# Patient Record
Sex: Male | Born: 1970 | Race: White | Hispanic: No | Smoking: Current every day smoker
Health system: Southern US, Community
[De-identification: ages and names within clinical notes are randomized; demographics above are authoritative.]

## PROBLEM LIST (undated history)

## (undated) DIAGNOSIS — I639 Cerebral infarction, unspecified: Secondary | ICD-10-CM

## (undated) DIAGNOSIS — M545 Low back pain: Secondary | ICD-10-CM

## (undated) DIAGNOSIS — I10 Essential (primary) hypertension: Secondary | ICD-10-CM

## (undated) HISTORY — PX: NO PAST SURGERIES: SHX2092

---
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
## 1998-03-10 ENCOUNTER — Ambulatory Visit (HOSPITAL_COMMUNITY): Admission: RE | Admit: 1998-03-10 | Discharge: 1998-03-10 | Payer: Self-pay | Admitting: Gynecology

## 1998-04-12 ENCOUNTER — Ambulatory Visit (HOSPITAL_COMMUNITY): Admission: RE | Admit: 1998-04-12 | Discharge: 1998-04-12 | Payer: Self-pay | Admitting: Urology

## 1998-04-12 ENCOUNTER — Encounter: Payer: Self-pay | Admitting: Urology

## 1998-04-20 ENCOUNTER — Ambulatory Visit (HOSPITAL_COMMUNITY): Admission: RE | Admit: 1998-04-20 | Discharge: 1998-04-20 | Payer: Self-pay | Admitting: Urology

## 2005-12-23 ENCOUNTER — Emergency Department (HOSPITAL_COMMUNITY): Admission: EM | Admit: 2005-12-23 | Discharge: 2005-12-24 | Payer: Self-pay | Admitting: Emergency Medicine

## 2005-12-27 ENCOUNTER — Ambulatory Visit (HOSPITAL_COMMUNITY): Admission: RE | Admit: 2005-12-27 | Discharge: 2005-12-27 | Payer: Self-pay | Admitting: Orthopedic Surgery

## 2006-02-18 ENCOUNTER — Encounter: Admission: RE | Admit: 2006-02-18 | Discharge: 2006-04-12 | Payer: Self-pay | Admitting: Orthopedic Surgery

## 2007-05-15 HISTORY — PX: BACK SURGERY: SHX140

## 2007-06-11 ENCOUNTER — Ambulatory Visit (HOSPITAL_COMMUNITY): Admission: RE | Admit: 2007-06-11 | Discharge: 2007-06-12 | Payer: Self-pay | Admitting: Orthopedic Surgery

## 2008-05-24 IMAGING — CR DG LUMBAR SPINE 2-3V
1 series · 1 of 1 positions shown · non-contrast
Comparison: none

CLINICAL DATA: Back pain and right-sided HNP at L5-S1.
 LUMBAR SPINE ? 2 VIEWS ? 06/11/07:

[view not recorded]
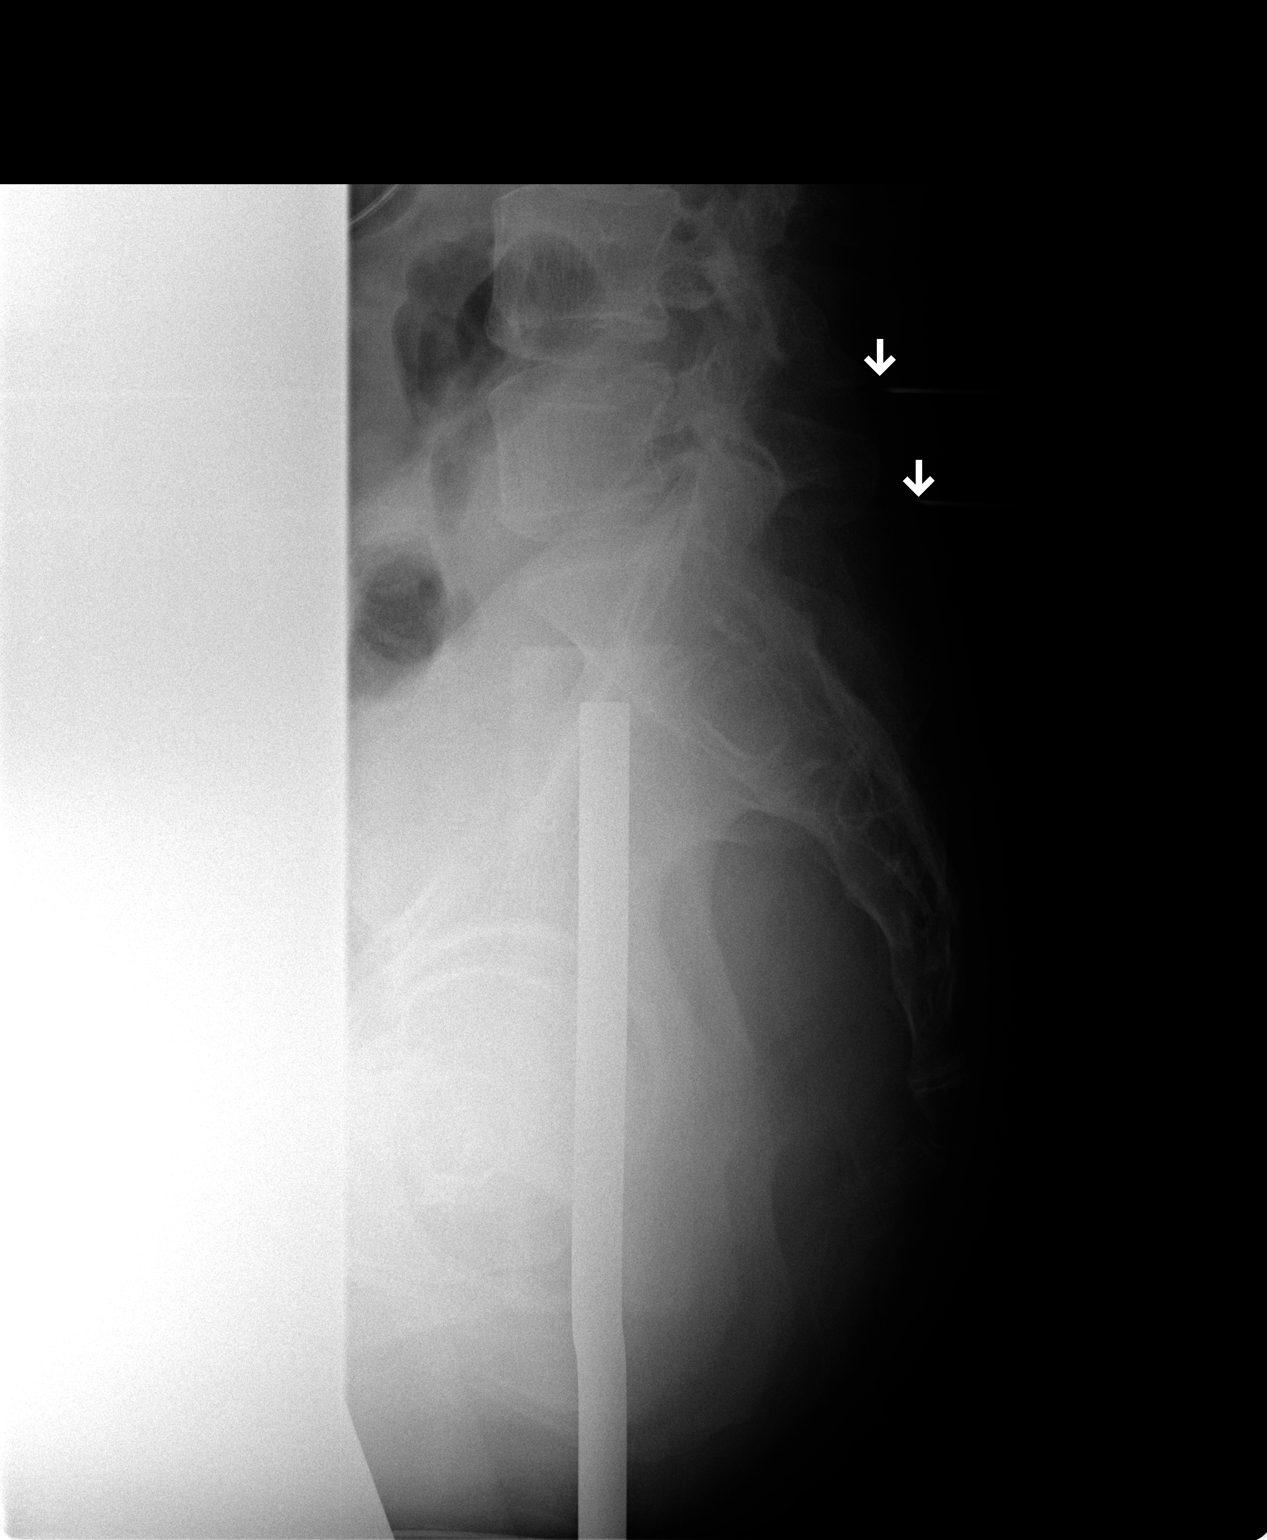

[1 of 1 positions shown; findings below may reference images not displayed]

FINDINGS: Intraoperative film #1 at 1777 hours demonstrates needles directed above and below the spinous process of L5.
IMPRESSION: L5 marked.
 Intraoperative film #2 at 7871 hours demonstrates a probe directed most closely towards the L5-S1 disk space from an inferior approach.
IMPRESSION: L5-S1 marked.

## 2010-09-26 NOTE — Op Note (Signed)
NAME:  Mason Decker, TOWN NO.:  [id number]   MEDICAL RECORD NO.:  [id number]          PATIENT TYPE:  AMB   LOCATION:  DAY                          FACILITY:  [facility]   PHYSICIAN:  Georges Lynch. Gioffre, M.D.DATE OF BIRTH:  [birth year]   DATE OF PROCEDURE:  06/11/2007  DATE OF DISCHARGE:                               OPERATIVE REPORT   SURGEON:  Georges Lynch. Darrelyn Hillock, M.D.   ASSISTANT:  Venita Lick, MD   PREOPERATIVE DIAGNOSES:  1. 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  2. Herniated lumbar disk L5-S1 on the right.   POSTOPERATIVE DIAGNOSES:  1. 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  2. Herniated lumbar disk L5-S1 on the right.   OPERATION:  1. Decompression of the lateral recess at L5-S1 on the right.  2. Microdiskectomy at L5-S1 on the right for a large herniated disk.   Note all his pain was in the right lower extremity only preop.   PROCEDURE:  Under general anesthesia, routine orthopedic prep and  draping of the lower back was carried out.  The patient had 2 grams IV  Ancef preop.  Sterile prep and drape was carried out and he had his 2  grams of IV Ancef.  Two needles were placed in back for localization  purposes.  An x-ray was taken.  Following that we then made an incision  over the L5-S1 interspace.  Bleeders identified and cauterized.  We went  down to the L5-S1 space, separated the muscle from the lamina and  spinous process and inserted self-retaining retractors.  Following that,  we then did a hemilaminectomy and decompressed the lateral recess.  The  ligamentum flavum was extremely thick and he was very tight in the  recess.  We brought the microscope in to complete the dissection.  We  gently removed the ligamentum flavum from the dura.  We went down  identified the disk space, gently retracted the dura.  There was a large  herniated disk at L5-S1 on the right.  Following that, we cauterized  lateral recess veins in the usual fashion.   I went down made a cruciate  incision in the disk space.  I utilized a nerve hook and the Epstein  curettes and brought to the disk material out from under the  subligamentous space.  We then went down into the disk space and  completed diskectomy.  Note, multiple passes were made up under the  subligamentous space.  We did a nice foraminotomy as well decompressed  the recess.  After the procedure was done we were able to easily pass a  hockey-stick out the foramina for the S1 root and the 5 root.  We had a  nice thorough decompression.  The dura moved over much easier now.  We  thoroughly irrigated out the area again, loosely applied some thrombin-  soaked Gelfoam, closed the wound layers usual fashion.  I left a small  distal part of the wound deep part of the wound open for  drainage purposes.  Sterile Neosporin dressings were applied.  Note  prior to  surgery, I did inject about 10 mL of 0.5% Marcaine epinephrine  into the wound site, that was prior to the surgery.  As I mentioned then  we applied sterile dressings.  Following that before he left the  operating he had 10 mg of Decadron IV.           ______________________________  Georges Lynch. Darrelyn Hillock, M.D.     RAG/MEDQ  D:  06/11/2007  T:  06/12/2007  Job:  [id number]

## 2011-02-01 LAB — DIFFERENTIAL
Basophils Absolute: 0
Basophils Relative: 0
Eosinophils Absolute: 0.1
Neutro Abs: 7.1
Neutrophils Relative %: 80 — ABNORMAL HIGH

## 2011-02-01 LAB — PROTIME-INR
INR: 1
Prothrombin Time: 13.1

## 2011-02-01 LAB — URINALYSIS, ROUTINE W REFLEX MICROSCOPIC
Bilirubin Urine: NEGATIVE
Glucose, UA: NEGATIVE
Nitrite: NEGATIVE
Specific Gravity, Urine: 1.004 — ABNORMAL LOW
pH: 8

## 2011-02-01 LAB — CBC
HCT: 42.2
Hemoglobin: 14.6
RBC: 4.43

## 2011-02-01 LAB — COMPREHENSIVE METABOLIC PANEL
ALT: 29
Alkaline Phosphatase: 42
BUN: 7
CO2: 27
GFR calc non Af Amer: >60
Glucose, Bld: 99
Potassium: 4.1
Sodium: 137
Total Bilirubin: 1.1

## 2011-02-01 LAB — APTT: aPTT: 27

## 2018-03-04 ENCOUNTER — Emergency Department (HOSPITAL_COMMUNITY): Payer: BLUE CROSS/BLUE SHIELD

## 2018-03-04 ENCOUNTER — Other Ambulatory Visit: Payer: Self-pay

## 2018-03-04 ENCOUNTER — Inpatient Hospital Stay (HOSPITAL_COMMUNITY)
Admission: EM | Admit: 2018-03-04 | Discharge: 2018-03-05 | DRG: 062 | Disposition: A | Discharge status: Home / Self Care | Payer: BLUE CROSS/BLUE SHIELD | Attending: Neurology | Admitting: Neurology

## 2018-03-04 ENCOUNTER — Inpatient Hospital Stay (HOSPITAL_COMMUNITY): Payer: BLUE CROSS/BLUE SHIELD

## 2018-03-04 ENCOUNTER — Encounter (HOSPITAL_COMMUNITY): Payer: Self-pay | Admitting: Neurology

## 2018-03-04 DIAGNOSIS — R402142 Coma scale, eyes open, spontaneous, at arrival to emergency department: Secondary | ICD-10-CM | POA: Diagnosis present

## 2018-03-04 DIAGNOSIS — I426 Alcoholic cardiomyopathy: Secondary | ICD-10-CM | POA: Diagnosis not present

## 2018-03-04 DIAGNOSIS — R402252 Coma scale, best verbal response, oriented, at arrival to emergency department: Secondary | ICD-10-CM | POA: Diagnosis present

## 2018-03-04 DIAGNOSIS — R402362 Coma scale, best motor response, obeys commands, at arrival to emergency department: Secondary | ICD-10-CM | POA: Diagnosis present

## 2018-03-04 DIAGNOSIS — I63311 Cerebral infarction due to thrombosis of right middle cerebral artery: Secondary | ICD-10-CM | POA: Diagnosis not present

## 2018-03-04 DIAGNOSIS — I161 Hypertensive emergency: Secondary | ICD-10-CM | POA: Diagnosis present

## 2018-03-04 DIAGNOSIS — Z23 Encounter for immunization: Secondary | ICD-10-CM | POA: Diagnosis not present

## 2018-03-04 DIAGNOSIS — I639 Cerebral infarction, unspecified: Secondary | ICD-10-CM | POA: Diagnosis present

## 2018-03-04 DIAGNOSIS — F17210 Nicotine dependence, cigarettes, uncomplicated: Secondary | ICD-10-CM | POA: Diagnosis present

## 2018-03-04 DIAGNOSIS — F10239 Alcohol dependence with withdrawal, unspecified: Secondary | ICD-10-CM | POA: Diagnosis present

## 2018-03-04 DIAGNOSIS — F17200 Nicotine dependence, unspecified, uncomplicated: Secondary | ICD-10-CM | POA: Diagnosis not present

## 2018-03-04 DIAGNOSIS — F1010 Alcohol abuse, uncomplicated: Secondary | ICD-10-CM | POA: Diagnosis present

## 2018-03-04 DIAGNOSIS — R471 Dysarthria and anarthria: Secondary | ICD-10-CM | POA: Diagnosis present

## 2018-03-04 DIAGNOSIS — G8194 Hemiplegia, unspecified affecting left nondominant side: Secondary | ICD-10-CM | POA: Diagnosis present

## 2018-03-04 DIAGNOSIS — E785 Hyperlipidemia, unspecified: Secondary | ICD-10-CM | POA: Diagnosis not present

## 2018-03-04 DIAGNOSIS — I6381 Other cerebral infarction due to occlusion or stenosis of small artery: Secondary | ICD-10-CM | POA: Diagnosis present

## 2018-03-04 DIAGNOSIS — I5030 Unspecified diastolic (congestive) heart failure: Secondary | ICD-10-CM | POA: Diagnosis not present

## 2018-03-04 DIAGNOSIS — Z8249 Family history of ischemic heart disease and other diseases of the circulatory system: Secondary | ICD-10-CM

## 2018-03-04 DIAGNOSIS — R29705 NIHSS score 5: Secondary | ICD-10-CM | POA: Diagnosis present

## 2018-03-04 LAB — I-STAT CHEM 8, ED
BUN: 14 mg/dL (ref 6–20)
CHLORIDE: 102 mmol/L (ref 98–111)
CREATININE: 0.8 mg/dL (ref 0.61–1.24)
Calcium, Ion: 1.06 mmol/L — ABNORMAL LOW (ref 1.15–1.40)
Glucose, Bld: 102 mg/dL — ABNORMAL HIGH (ref 70–99)
HEMATOCRIT: 49 % (ref 39.0–52.0)
Hemoglobin: 16.7 g/dL (ref 13.0–17.0)
POTASSIUM: 5.7 mmol/L — AB (ref 3.5–5.1)
Sodium: 135 mmol/L (ref 135–145)
TCO2: 29 mmol/L (ref 22–32)

## 2018-03-04 LAB — DIFFERENTIAL
Abs Immature Granulocytes: 0.03 10*3/uL (ref 0.00–0.07)
BASOS PCT: 1 %
Basophils Absolute: 0.1 10*3/uL (ref 0.0–0.1)
Eosinophils Absolute: 0.4 10*3/uL (ref 0.0–0.5)
Eosinophils Relative: 4 %
Immature Granulocytes: 0 %
Lymphocytes Relative: 28 %
Lymphs Abs: 2.8 10*3/uL (ref 0.7–4.0)
MONO ABS: 0.8 10*3/uL (ref 0.1–1.0)
MONOS PCT: 7 %
NEUTROS PCT: 60 %
Neutro Abs: 6.2 10*3/uL (ref 1.7–7.7)

## 2018-03-04 LAB — COMPREHENSIVE METABOLIC PANEL
ALT: 16 U/L (ref 0–44)
AST: 18 U/L (ref 15–41)
Albumin: 4.4 g/dL (ref 3.5–5.0)
Alkaline Phosphatase: 50 U/L (ref 38–126)
Anion gap: 9 (ref 5–15)
BUN: 10 mg/dL (ref 6–20)
CHLORIDE: 105 mmol/L (ref 98–111)
CO2: 21 mmol/L — AB (ref 22–32)
CREATININE: 0.92 mg/dL (ref 0.61–1.24)
Calcium: 9.5 mg/dL (ref 8.9–10.3)
GFR calc Af Amer: >60 mL/min (ref >60)
GLUCOSE: 103 mg/dL — AB (ref 70–99)
POTASSIUM: 3.9 mmol/L (ref 3.5–5.1)
Sodium: 135 mmol/L (ref 135–145)
Total Bilirubin: 0.9 mg/dL (ref 0.3–1.2)
Total Protein: 7.1 g/dL (ref 6.5–8.1)

## 2018-03-04 LAB — CBC
HCT: 49.2 % (ref 39.0–52.0)
HEMOGLOBIN: 16.1 g/dL (ref 13.0–17.0)
MCH: 32.9 pg (ref 26.0–34.0)
MCHC: 32.7 g/dL (ref 30.0–36.0)
MCV: 100.6 fL — ABNORMAL HIGH (ref 80.0–100.0)
Platelets: 274 10*3/uL (ref 150–400)
RBC: 4.89 MIL/uL (ref 4.22–5.81)
RDW: 11.8 % (ref 11.5–15.5)
WBC: 10.2 10*3/uL (ref 4.0–10.5)
nRBC: 0 % (ref 0.0–0.2)

## 2018-03-04 LAB — MRSA PCR SCREENING: MRSA BY PCR: NEGATIVE

## 2018-03-04 LAB — I-STAT TROPONIN, ED: TROPONIN I, POC: 0.01 ng/mL (ref 0.00–0.08)

## 2018-03-04 LAB — PROTIME-INR
INR: 1.12
Prothrombin Time: 14.3 seconds (ref 11.4–15.2)

## 2018-03-04 LAB — CBG MONITORING, ED: Glucose-Capillary: 98 mg/dL (ref 70–99)

## 2018-03-04 LAB — APTT: APTT: 29 s (ref 24–36)

## 2018-03-04 MED ORDER — SODIUM CHLORIDE 0.9 % IV BOLUS
250.0000 mL | Freq: Once | INTRAVENOUS | Status: AC
  Administered 2018-03-04: 250 mL via INTRAVENOUS

## 2018-03-04 MED ORDER — IOPAMIDOL (ISOVUE-370) INJECTION 76%
INTRAVENOUS | Status: AC
  Filled 2018-03-04: qty 50

## 2018-03-04 MED ORDER — VITAMIN B-1 100 MG PO TABS
100.0000 mg | ORAL_TABLET | Freq: Every day | ORAL | Status: DC
  Administered 2018-03-05: 100 mg via ORAL
  Filled 2018-03-04: qty 1

## 2018-03-04 MED ORDER — CLEVIDIPINE BUTYRATE 0.5 MG/ML IV EMUL
INTRAVENOUS | Status: AC
  Administered 2018-03-04: 1 mg
  Filled 2018-03-04: qty 50

## 2018-03-04 MED ORDER — IOPAMIDOL (ISOVUE-370) INJECTION 76%
100.0000 mL | Freq: Once | INTRAVENOUS | Status: AC
  Administered 2018-03-04: 50 mL via INTRAVENOUS

## 2018-03-04 MED ORDER — STROKE: EARLY STAGES OF RECOVERY BOOK
Freq: Once | Status: DC
  Filled 2018-03-04: qty 1

## 2018-03-04 MED ORDER — INFLUENZA VAC SPLIT QUAD 0.5 ML IM SUSY
0.5000 mL | PREFILLED_SYRINGE | INTRAMUSCULAR | Status: AC
  Administered 2018-03-05: 0.5 mL via INTRAMUSCULAR
  Filled 2018-03-04: qty 0.5

## 2018-03-04 MED ORDER — SENNOSIDES-DOCUSATE SODIUM 8.6-50 MG PO TABS: 1.0000 | ORAL_TABLET | Freq: Every evening | ORAL | Status: DC | PRN

## 2018-03-04 MED ORDER — LABETALOL HCL 5 MG/ML IV SOLN
10.0000 mg | INTRAVENOUS | Status: DC | PRN
  Administered 2018-03-05: 20 mg via INTRAVENOUS
  Administered 2018-03-05: 10 mg via INTRAVENOUS
  Administered 2018-03-05: 20 mg via INTRAVENOUS
  Administered 2018-03-05: 10 mg via INTRAVENOUS
  Filled 2018-03-04 (×3): qty 4

## 2018-03-04 MED ORDER — FOLIC ACID 1 MG PO TABS
1.0000 mg | ORAL_TABLET | Freq: Every day | ORAL | Status: DC
  Administered 2018-03-05: 1 mg via ORAL
  Filled 2018-03-04: qty 1

## 2018-03-04 MED ORDER — SODIUM CHLORIDE 0.9 % IV SOLN
INTRAVENOUS | Status: DC
  Administered 2018-03-04: 13:00:00 via INTRAVENOUS

## 2018-03-04 MED ORDER — DEXMEDETOMIDINE HCL IN NACL 200 MCG/50ML IV SOLN: 0.2000 ug/kg/h | INTRAVENOUS | Status: DC

## 2018-03-04 MED ORDER — FAMOTIDINE IN NACL 20-0.9 MG/50ML-% IV SOLN
20.0000 mg | Freq: Two times a day (BID) | INTRAVENOUS | Status: DC
  Administered 2018-03-04 – 2018-03-05 (×2): 20 mg via INTRAVENOUS
  Filled 2018-03-04 (×2): qty 50

## 2018-03-04 MED ORDER — LABETALOL HCL 5 MG/ML IV SOLN
INTRAVENOUS | Status: AC | PRN
  Administered 2018-03-04: 10 mg via INTRAVENOUS
  Administered 2018-03-04 (×2): 20 mg via INTRAVENOUS

## 2018-03-04 MED ORDER — ALTEPLASE (STROKE) FULL DOSE INFUSION
0.9000 mg/kg | Freq: Once | INTRAVENOUS | Status: AC
  Administered 2018-03-04: 79.6 mg via INTRAVENOUS
  Filled 2018-03-04: qty 100

## 2018-03-04 MED ORDER — ACETAMINOPHEN 650 MG RE SUPP: 650.0000 mg | RECTAL | Status: DC | PRN

## 2018-03-04 MED ORDER — LABETALOL HCL 5 MG/ML IV SOLN
INTRAVENOUS | Status: AC
  Filled 2018-03-04: qty 4

## 2018-03-04 MED ORDER — SODIUM CHLORIDE 0.9 % IV SOLN
50.0000 mL | Freq: Once | INTRAVENOUS | Status: AC
  Administered 2018-03-04: 50 mL via INTRAVENOUS

## 2018-03-04 MED ORDER — ACETAMINOPHEN 160 MG/5ML PO SOLN: 650.0000 mg | ORAL | Status: DC | PRN

## 2018-03-04 MED ORDER — ACETAMINOPHEN 325 MG PO TABS
650.0000 mg | ORAL_TABLET | ORAL | Status: DC | PRN
  Administered 2018-03-04: 650 mg via ORAL
  Filled 2018-03-04: qty 2

## 2018-03-04 MED ORDER — CLEVIDIPINE BUTYRATE 0.5 MG/ML IV EMUL
0.0000 mg/h | INTRAVENOUS | Status: DC
  Administered 2018-03-04: 19 mg/h via INTRAVENOUS
  Administered 2018-03-04: 16 mg/h via INTRAVENOUS
  Filled 2018-03-04 (×2): qty 50

## 2018-03-04 NOTE — Code Documentation (Signed)
47 year old male coming from work where he was noted to have sudden onset of left facial numbness, slurred speech, and left arm weakness at 0915. Coworker called 911 and EMS activated a Code Stroke. Stroke Team met patient upon arrival. Initial NIHSS 5 due to left facial droop, slurred speech, decreased sensory, left arm and leg drift. CT negative for hemorrhage. BP 240 palpable. 20 mg of labetalol given. SBP remained 222/135. Second dose of 20 mg of Labetalol given. BP remains elevated. Cleviprex started at 1027. Titrated per protocol. Pt's BP within parameter for tPA at 1038. tPA started. Cleviprex decreased. Pt alert an oriented x4. Pt to be admitted to [place].

## 2018-03-04 NOTE — ED Provider Notes (Signed)
[facility] EMERGENCY DEPARTMENT Provider Note   CSN: [id number] Arrival date & time: 03/04/18  1011   An emergency department physician performed an initial assessment on this suspected stroke patient at 1012.  History   Chief Complaint Chief Complaint  Patient presents with  . Code Stroke    HPI Mason Decker is a 47 y.o. male.  47 year old male with prior medical history as detailed below presents for evaluation of acute onset left-sided weakness.  Patient was a code stroke upon arrival.  Neuro team has evaluated the patient and deemed him to be not a candidate for TPA upon initial assessment.  Patient's blood pressure was significantly elevated upon arrival.  BP was controlled and the TPA was given.   Patient will be admitted to the neurology service for further work-up and treatment of his acute CVA.  The history is provided by the patient.  Cerebrovascular Accident  This is a new problem. The current episode started 1 to 2 hours ago. The problem occurs constantly. The problem has not changed since onset.Pertinent negatives include no chest pain and no abdominal pain. Nothing aggravates the symptoms. Nothing relieves the symptoms. He has tried nothing for the symptoms.    No past medical history on file.  Patient Active Problem List   Diagnosis Date Noted  . CVA (cerebral vascular accident) (HCC) 03/04/2018        Home Medications    Prior to Admission medications   Not on File    Family History Family History  Problem Relation Age of Onset  . Hypertension Mother   . Hypertension Father     Social History Social History   Tobacco Use  . Smoking status: Not on file  Substance Use Topics  . Alcohol use: Not on file  . Drug use: Not on file     Allergies   Patient has no allergy information on record.   Review of Systems Review of Systems  Cardiovascular: Negative for chest pain.  Gastrointestinal: Negative for abdominal  pain.  All other systems reviewed and are negative.    Physical Exam Updated Vital Signs BP (!) 163/103   Pulse 90   Resp 18   Wt 88.4 kg   SpO2 94%   Physical Exam  Constitutional: He is oriented to person, place, and time. He appears well-developed and well-nourished. No distress.  HENT:  Head: Normocephalic and atraumatic.  Mouth/Throat: Oropharynx is clear and moist.  Eyes: Pupils are equal, round, and reactive to light. Conjunctivae and EOM are normal.  Neck: Normal range of motion. Neck supple.  Cardiovascular: Normal rate, regular rhythm and normal heart sounds.  Pulmonary/Chest: Effort normal and breath sounds normal. No respiratory distress.  Abdominal: Soft. He exhibits no distension. There is no tenderness.  Musculoskeletal: Normal range of motion. He exhibits no edema or deformity.  Neurological: He is alert and oriented to person, place, and time.  AOx3 Normal speech Mild left facial droop  4/5 strength to LUE and LLE 5/5 strength to RUE and RLE   Skin: Skin is warm and dry.  Psychiatric: He has a normal mood and affect.  Nursing note and vitals reviewed.    ED Treatments / Results  Labs (all labs ordered are listed, but only abnormal results are displayed) Labs Reviewed  CBC - Abnormal; Notable for the following components:      Result Value   MCV 100.6 (*)    All other components within normal limits  I-STAT CHEM 8,  ED - Abnormal; Notable for the following components:   Potassium 5.7 (*)    Glucose, Bld 102 (*)    Calcium, Ion 1.06 (*)    All other components within normal limits  PROTIME-INR  APTT  DIFFERENTIAL  COMPREHENSIVE METABOLIC PANEL  HIV ANTIBODY (ROUTINE TESTING W REFLEX)  I-STAT TROPONIN, ED  CBG MONITORING, ED    EKG EKG Interpretation  Date/Time:  Tuesday March 04 2018 10:37:36 EDT Ventricular Rate:  92 PR Interval:    QRS Duration: 95 QT Interval:  402 QTC Calculation: 498 R Axis:   59 Text Interpretation:  Atrial  fibrillation Probable left ventricular hypertrophy Borderline prolonged QT interval Confirmed by Kristine Royal [phone]) on 03/04/2018 10:40:49 AM   Radiology Ct Head Code Stroke Wo Contrast  Result Date: 03/04/2018 CLINICAL DATA:  Code stroke. 47 year old male with sudden onset left side weakness, facial droop and slurred speech. EXAM: CT HEAD WITHOUT CONTRAST TECHNIQUE: Contiguous axial images were obtained from the base of the skull through the vertex without intravenous contrast. COMPARISON:  Head CT 12/23/2005. FINDINGS: Brain: Circumscribed oval hypodensity in the right lower pons on series 3 image 8 is new since 2007. There has been generalized cerebral volume loss since that time. Similar chronic appearing oval hypodensity in the posterior right lentiform on image 15 is new. No midline shift, ventriculomegaly, mass effect, evidence of mass lesion, intracranial hemorrhage or evidence of cortically based acute infarction. Vascular:  No suspicious intracranial vascular hyperdensity. Skull: Negative. Sinuses/Orbits: Increased anterior ethmoid and right frontal sinus mucosal thickening since 2007. Other Visualized paranasal sinuses and mastoids are stable and well pneumatized. Other: No acute orbit or scalp soft tissue findings. Postoperative changes to the right globe. ASPECTS [facility] Stroke Program Early CT Score) - Ganglionic level infarction (caudate, lentiform nuclei, internal capsule, insula, M1-M3 cortex): 7 - Supraganglionic infarction (M4-M6 cortex): 3 Total score (0-10 with 10 being normal): 10 IMPRESSION: 1. Age indeterminate but probably chronic lacunar infarcts in the right lentiform and right pons. 2. No superimposed acute cortically based infarct or acute intracranial hemorrhage. ASPECTS is 10. 3. These results were communicated to Dr. Laurence Slate at 10:33 amon 10/22/2019by text page via the [facility] messaging system. Electronically Signed   By: Odessa Fleming M.D.   On: 03/04/2018 10:34     Procedures Procedures (including critical care time) CRITICAL CARE Performed by: Wynetta Fines   Total critical care time: 15 minutes  Critical care time was exclusive of separately billable procedures and treating other patients.  Critical care was necessary to treat or prevent imminent or life-threatening deterioration.  Critical care was time spent personally by me on the following activities: development of treatment plan with patient and/or surrogate as well as nursing, discussions with consultants, evaluation of patient's response to treatment, examination of patient, obtaining history from patient or surrogate, ordering and performing treatments and interventions, ordering and review of laboratory studies, ordering and review of radiographic studies, pulse oximetry and re-evaluation of patient's condition.   Medications Ordered in ED Medications  alteplase (ACTIVASE) 1 mg/mL infusion 79.6 mg (79.6 mg Intravenous New Bag/Given 03/04/18 1038)    Followed by  0.9 %  sodium chloride infusion (has no administration in time range)  labetalol (NORMODYNE,TRANDATE) 5 MG/ML injection (has no administration in time range)  iopamidol (ISOVUE-370) 76 % injection 100 mL (has no administration in time range)   stroke: mapping our early stages of recovery book (has no administration in time range)  0.9 %  sodium chloride infusion (has no  administration in time range)  acetaminophen (TYLENOL) tablet 650 mg (has no administration in time range)    Or  acetaminophen (TYLENOL) solution 650 mg (has no administration in time range)    Or  acetaminophen (TYLENOL) suppository 650 mg (has no administration in time range)  senna-docusate (Senokot-S) tablet 1 tablet (has no administration in time range)  famotidine (PEPCID) IVPB 20 mg premix (has no administration in time range)  labetalol (NORMODYNE,TRANDATE) injection (10 mg Intravenous Given 03/04/18 1037)  clevidipine (CLEVIPREX) 0.5 MG/ML  infusion (16 mg/hr  Rate/Dose Change 03/04/18 1043)     Initial Impression / Assessment and Plan / ED Course  I have reviewed the triage vital signs and the nursing notes.  Pertinent labs & imaging results that were available during my care of the patient were reviewed by me and considered in my medical decision making (see chart for details).    MDM  Screen complete  47 year old male is presenting with symptoms consistent with acute CVA.  Patient given TPA after improvement in his blood pressures.   Patient will be admitted directly to the neurology service for further care and workup.  Final Clinical Impressions(s) / ED Diagnoses   Final diagnoses:  Cerebrovascular accident (CVA), unspecified mechanism [facility])    ED Discharge Orders    None       Wynetta Fines, MD 03/04/18 1101

## 2018-03-04 NOTE — Progress Notes (Signed)
Pharmacist Code Stroke Response  Notified to mix tPA at 1022 by Dr. Laurence Slate Delivered tPA to RN at 1026   tPA dose = 8mg  bolus over 1 minute followed by 71.6mg  for a total dose of 79.6mg  over 1 hour  Issues/delays encountered (if applicable): Blood pressure was very high and required aggressive treatment  Mason Decker, Mason Decker 03/04/18 10:32 AM

## 2018-03-04 NOTE — ED Notes (Signed)
10mg  of labatelol given IV. Most recent pressure 207/121

## 2018-03-04 NOTE — Progress Notes (Signed)
PT Cancellation Note  Patient Details Name: Mason Decker MRN: [id number] DOB: [birth year]   Cancelled Treatment:    Reason Eval/Treat Not Completed: Active bedrest order s/p tPA   Mason Decker 03/04/2018, 11:52 AM Delaney Meigs, PT Acute Rehabilitation Services Pager: [phone] Office: [phone]

## 2018-03-04 NOTE — H&P (Addendum)
Neurology history and physical  Code stroke   History is obtained from: Patient and EMS  HPI: Mason Decker is a 47 y.o. male who does smoke but has no other past medical history and has not seen a primary care physician.  Patient noted at around 9 AM this morning that he had weakness on his left side decreased sensation on his left side including dysarthria.  Apparently he got to work and his coworkers noted this also.  Blood pressures were taken while at work and all showed that his blood pressures were systolically in the 914N and diastolically in the 829F.  Patient did not have any blurred vision, flaccidity, expressive or receptive aphasia.  It is unclear what his blood pressures are at baseline.  As stated patient takes no medication and/or aspirin.   While in the ED patient was rushed to the CT scan, there was no acute bleed on CT and patient was offered TPA.  Patient was immediately given TPA after Cleviprex was given to get the blood pressure down to 621 systolically and 308 diastolically   LKW: 6578 this a.m. on 03/04/2018 tpa given?  Yes Premorbid modified Rankin scale (mRS):0  ROS: A 14 point ROS was performed and is negative except as noted in the HPI.  Patient has no past medical history.  Family History  Problem Relation Age of Onset  . Hypertension Mother   . Hypertension Father     Social History: Patient does smoke.  Medications  Current Facility-Administered Medications:  .   stroke: mapping our early stages of recovery book, , Does not apply, Once, Marliss Coots, PA-C .  alteplase (ACTIVASE) 1 mg/mL infusion 79.6 mg, 0.9 mg/kg, Intravenous, Once, Last Rate: 79.6 mL/hr at 03/04/18 1038, 79.6 mg at 03/04/18 1038 **FOLLOWED BY** 0.9 %  sodium chloride infusion, 50 mL, Intravenous, Once, Aroor, Sushanth R, MD .  0.9 %  sodium chloride infusion, , Intravenous, Continuous, Marliss Coots, PA-C .  acetaminophen (TYLENOL) tablet 650 mg, 650 mg, Oral, Q4H PRN **OR**  acetaminophen (TYLENOL) solution 650 mg, 650 mg, Per Tube, Q4H PRN **OR** acetaminophen (TYLENOL) suppository 650 mg, 650 mg, Rectal, Q4H PRN, Marliss Coots, PA-C .  famotidine (PEPCID) IVPB 20 mg premix, 20 mg, Intravenous, Q12H, Marliss Coots, PA-C .  iopamidol (ISOVUE-370) 76 % injection 100 mL, 100 mL, Intravenous, Once, Aroor, Karena Addison R, MD .  labetalol (NORMODYNE,TRANDATE) 5 MG/ML injection, , , ,  .  senna-docusate (Senokot-S) tablet 1 tablet, 1 tablet, Oral, QHS PRN, Marliss Coots, PA-C No current outpatient medications on file.   Exam: Current vital signs: BP (!) 207/121 (BP Location: Left Arm)   Wt 88.4 kg   SpO2 97%  Vital signs in last 24 hours: BP: (207)/(121) 207/121 (10/22 1037) SpO2:  [97 %] 97 % (10/22 1037) Weight:  [88.4 kg] 88.4 kg (10/22 1000)  Physical Exam  Constitutional: Appears well-developed and well-nourished.  Psych: Affect appropriate to situation Eyes: No scleral injection HENT: No OP obstrucion Head: Normocephalic.  Cardiovascular: Normal rate and regular rhythm.  Respiratory: Effort normal, non-labored breathing GI: Soft.  No distension. There is no tenderness.  Skin: WDI  Neuro: Mental Status: Patient is awake, alert, oriented to person, place, month, year, and situation. Patient is able to give a clear and coherent history. Patient is dysarthric Cranial Nerves: II: Visual Fields are full. Pupils are equal, round, and reactive to light.   III,IV, VI: EOMI without ptosis or diploplia.  V: Facial sensation  is symmetric to temperature VII: Facial movement is asymmetric on the left VIII: hearing is intact to voice X: Uvula elevates symmetrically XI: Shoulder shrug is symmetric. XII: tongue is midline without atrophy or fasciculations.  Motor: 5/5 on the right arm and leg, 3/5 on the left arm and 4/5 leg  Sensory: Sensation decreased on the left arm and leg Deep Tendon Reflexes: 2+ and symmetric in the biceps and patellae.   Plantars: Toes are downgoing bilaterally.  Cerebellar: FNF and HKS are intact bilaterally  Labs I have reviewed labs in epic and the results pertinent to this consultation are:   CBC    Component Value Date/Time   WBC 10.2 03/04/2018 1014   RBC 4.89 03/04/2018 1014   HGB 16.7 03/04/2018 1018   HCT 49.0 03/04/2018 1018   PLT 274 03/04/2018 1014   MCV 100.6 (H) 03/04/2018 1014   MCH 32.9 03/04/2018 1014   MCHC 32.7 03/04/2018 1014   RDW 11.8 03/04/2018 1014   LYMPHSABS 2.8 03/04/2018 1014   MONOABS 0.8 03/04/2018 1014   EOSABS 0.4 03/04/2018 1014   BASOSABS 0.1 03/04/2018 1014    CMP     Component Value Date/Time   NA 135 03/04/2018 1018   K 5.7 (H) 03/04/2018 1018   CL 102 03/04/2018 1018   CO2 27 06/11/2007 1655   GLUCOSE 102 (H) 03/04/2018 1018   BUN 14 03/04/2018 1018   CREATININE 0.80 03/04/2018 1018   CALCIUM 9.3 06/11/2007 1655   PROT 6.3 06/11/2007 1655   ALBUMIN 4.1 06/11/2007 1655   AST 22 06/11/2007 1655   ALT 29 06/11/2007 1655   ALKPHOS 42 06/11/2007 1655   BILITOT 1.1 06/11/2007 1655   GFRNONAA >60 06/11/2007 1655   GFRAA  06/11/2007 1655    >60        The eGFR has been calculated using the MDRD equation. This calculation has not been validated in all clinical    Lipid Panel  No results found for: CHOL, TRIG, HDL, CHOLHDL, VLDL, LDLCALC, LDLDIRECT   Imaging I have reviewed the images obtained:  CT-scan of the brain--age indeterminate but probably chronic lacunar infarcts in the right lentiform and right pons.  No superimposed acute cortical based infarct.  MRI examination of the brain--pending  Mason Quill PA-C [facility] [phone]  M-F  (9:00 am- 5:00 PM)  03/04/2018, 10:43 AM    NEUROHOSPITALIST ADDENDUM Performed a face to face diagnostic evaluation.   I have reviewed the contents of history and physical exam as documented by PA/ARNP/Resident and agree with above documentation.  I have discussed and  formulated the  plan as documented below.  Edits to the note have been made as needed.  Assessment: 47 year old male with tobacco abuse and alcohol abuse presenting to the hospital as a code stroke.  Symptoms included left-sided weakness and decreased sensation along with dysarthria.  Patient was immediately brought to CT which showed no acute infarct or bleed however he does have old lenticular infarcts on the right and pontine infarct.   tPA delayed as patient blood pressure was 528 systolic, requiring Labetalol 28m push x2 and 137mpush and Clevedipine infusion.    Acute Ischemic stroke   Etiology: pending evaluation  Risk factors; HTN, tobacco abuse  Plan: - Admit to ICU with telemetry --Will obtain CTA of head and neck  -  MRI brain w/o contrast - Keep blood pressure parameters less than 180/105 - Neuro checks as per order set - No antiplatelets until 24hrs s/p TPA -  Repeat CT at 10 AM tomorrow which would be 24 hours - Stroke swallow screen - Echocardiogram  - Call neurologist on duty if any significant changes occur.  HTN Emergency - goal BP less than 180/105 as patient received IVtPA - Cleveprix and Labetolol PRN ordered   -Alcohol withdrawal  CIWA protocol ordered Did not order benzos, please call MD if patient very agitated  This patient is neurologically critically ill due to acute stroke requiring tPA.  He is at risk for significant risk of neurological worsening from cerebral edema,  death from brain herniation, heart failure, hemorrhagic conversion, infection, respiratory failure and seizure. This patient's care requires constant monitoring of vital signs, hemodynamics, respiratory and cardiac monitoring, review of multiple databases, neurological assessment, discussion with family, other specialists and medical decision making of high complexity.  I spent 70  minutes of neurocritical time in the care of this patient.      Karena Addison Aroor MD [facility] [phone]   If 7pm to 7am, please call on call as listed on AMION.

## 2018-03-05 ENCOUNTER — Inpatient Hospital Stay (HOSPITAL_COMMUNITY): Payer: BLUE CROSS/BLUE SHIELD

## 2018-03-05 ENCOUNTER — Encounter (HOSPITAL_COMMUNITY): Payer: Self-pay | Admitting: Nurse Practitioner

## 2018-03-05 ENCOUNTER — Ambulatory Visit (HOSPITAL_COMMUNITY): Payer: BLUE CROSS/BLUE SHIELD

## 2018-03-05 DIAGNOSIS — I63311 Cerebral infarction due to thrombosis of right middle cerebral artery: Secondary | ICD-10-CM

## 2018-03-05 DIAGNOSIS — I161 Hypertensive emergency: Secondary | ICD-10-CM

## 2018-03-05 DIAGNOSIS — F17200 Nicotine dependence, unspecified, uncomplicated: Secondary | ICD-10-CM

## 2018-03-05 DIAGNOSIS — I5030 Unspecified diastolic (congestive) heart failure: Secondary | ICD-10-CM

## 2018-03-05 DIAGNOSIS — E785 Hyperlipidemia, unspecified: Secondary | ICD-10-CM

## 2018-03-05 DIAGNOSIS — I426 Alcoholic cardiomyopathy: Secondary | ICD-10-CM

## 2018-03-05 DIAGNOSIS — F1010 Alcohol abuse, uncomplicated: Secondary | ICD-10-CM | POA: Diagnosis present

## 2018-03-05 LAB — RAPID URINE DRUG SCREEN, HOSP PERFORMED
Amphetamines: NOT DETECTED
BENZODIAZEPINES: NOT DETECTED
Barbiturates: NOT DETECTED
COCAINE: NOT DETECTED
OPIATES: NOT DETECTED
Tetrahydrocannabinol: NOT DETECTED

## 2018-03-05 LAB — CBC
HEMATOCRIT: 45.6 % (ref 39.0–52.0)
Hemoglobin: 15 g/dL (ref 13.0–17.0)
MCH: 32 pg (ref 26.0–34.0)
MCHC: 32.9 g/dL (ref 30.0–36.0)
MCV: 97.2 fL (ref 80.0–100.0)
Platelets: 258 10*3/uL (ref 150–400)
RBC: 4.69 MIL/uL (ref 4.22–5.81)
RDW: 11.9 % (ref 11.5–15.5)
WBC: 8.2 10*3/uL (ref 4.0–10.5)
nRBC: 0 % (ref 0.0–0.2)

## 2018-03-05 LAB — COMPREHENSIVE METABOLIC PANEL
ALT: 14 U/L (ref 0–44)
AST: 14 U/L — AB (ref 15–41)
Albumin: 3.6 g/dL (ref 3.5–5.0)
Alkaline Phosphatase: 38 U/L (ref 38–126)
Anion gap: 8 (ref 5–15)
BUN: 10 mg/dL (ref 6–20)
CO2: 24 mmol/L (ref 22–32)
Calcium: 9.1 mg/dL (ref 8.9–10.3)
Chloride: 106 mmol/L (ref 98–111)
Creatinine, Ser: 0.91 mg/dL (ref 0.61–1.24)
GFR calc Af Amer: >60 mL/min (ref >60)
GFR calc non Af Amer: >60 mL/min (ref >60)
GLUCOSE: 101 mg/dL — AB (ref 70–99)
POTASSIUM: 3.5 mmol/L (ref 3.5–5.1)
Sodium: 138 mmol/L (ref 135–145)
Total Bilirubin: 0.6 mg/dL (ref 0.3–1.2)
Total Protein: 5.9 g/dL — ABNORMAL LOW (ref 6.5–8.1)

## 2018-03-05 LAB — HIV ANTIBODY (ROUTINE TESTING W REFLEX): HIV Screen 4th Generation wRfx: NONREACTIVE

## 2018-03-05 LAB — ECHOCARDIOGRAM COMPLETE
Height: 70 in
Weight: 3111.13 oz

## 2018-03-05 LAB — LIPID PANEL
Cholesterol: 164 mg/dL (ref 0–200)
HDL: 54 mg/dL (ref >40)
LDL CALC: 89 mg/dL (ref 0–99)
TRIGLYCERIDES: 106 mg/dL (ref <150)
Total CHOL/HDL Ratio: 3 RATIO
VLDL: 21 mg/dL (ref 0–40)

## 2018-03-05 LAB — HEMOGLOBIN A1C
HEMOGLOBIN A1C: 4.8 % (ref 4.8–5.6)
Mean Plasma Glucose: 91.06 mg/dL

## 2018-03-05 MED ORDER — ATORVASTATIN CALCIUM 10 MG PO TABS: 10.0000 mg | ORAL_TABLET | Freq: Every day | ORAL | 2 refills | Status: AC

## 2018-03-05 MED ORDER — AMLODIPINE BESYLATE 10 MG PO TABS: 10.0000 mg | ORAL_TABLET | Freq: Every day | ORAL | 2 refills | Status: AC

## 2018-03-05 MED ORDER — LORAZEPAM 2 MG/ML IJ SOLN
2.0000 mg | Freq: Once | INTRAMUSCULAR | Status: AC
  Administered 2018-03-05: 2 mg via INTRAVENOUS
  Filled 2018-03-05: qty 1

## 2018-03-05 MED ORDER — ASPIRIN EC 81 MG PO TBEC
81.0000 mg | DELAYED_RELEASE_TABLET | Freq: Every day | ORAL | Status: DC
  Administered 2018-03-05: 81 mg via ORAL
  Filled 2018-03-05: qty 1

## 2018-03-05 MED ORDER — ATORVASTATIN CALCIUM 10 MG PO TABS: 10.0000 mg | ORAL_TABLET | Freq: Every day | ORAL | Status: DC

## 2018-03-05 MED ORDER — AMLODIPINE BESYLATE 10 MG PO TABS: 10.0000 mg | ORAL_TABLET | Freq: Every day | ORAL | Status: DC

## 2018-03-05 MED ORDER — ASPIRIN 81 MG PO TBEC: 81.0000 mg | DELAYED_RELEASE_TABLET | Freq: Every day | ORAL | Status: DC

## 2018-03-05 MED ORDER — AMLODIPINE BESYLATE 5 MG PO TABS
5.0000 mg | ORAL_TABLET | Freq: Every day | ORAL | Status: DC
  Administered 2018-03-05: 5 mg via ORAL
  Filled 2018-03-05: qty 1

## 2018-03-05 MED ORDER — CLOPIDOGREL BISULFATE 75 MG PO TABS: 75.0000 mg | ORAL_TABLET | Freq: Every day | ORAL | 0 refills | Status: DC

## 2018-03-05 MED ORDER — CLOPIDOGREL BISULFATE 75 MG PO TABS
75.0000 mg | ORAL_TABLET | Freq: Every day | ORAL | Status: DC
  Administered 2018-03-05: 75 mg via ORAL
  Filled 2018-03-05: qty 1

## 2018-03-05 MED ORDER — FAMOTIDINE 20 MG PO TABS: 20.0000 mg | ORAL_TABLET | Freq: Two times a day (BID) | ORAL | Status: DC

## 2018-03-05 NOTE — Progress Notes (Signed)
STROKE TEAM PROGRESS NOTE   INTERVAL HISTORY His mother and father are at the bedside.  His sister (EMT) is on the telephone. MRI shows acute infarct with age advanced small vessel disease and atrophy. Therapy evaluating currently. Possible d/c later today. Family reports he has been feeling bad since the summer. Had an episode of L sided numbness while riding a horse that could explain one of the old R brain lacunes, hard to know.   Vitals:   03/05/18 1000 03/05/18 1108 03/05/18 1200 03/05/18 1300  BP:  (!) 174/116 (!) 176/114 (!) 176/118  Pulse: 66  88 66  Resp: 15  (!) 23 (!) 0  Temp:   98.3 F (36.8 C)   TempSrc:   Oral   SpO2: 96%  100% 100%  Weight:      Height:        CBC:  Recent Labs  Lab 03/04/18 1014 03/04/18 1018 03/05/18 0418  WBC 10.2  --  8.2  NEUTROABS 6.2  --   --   HGB 16.1 16.7 15.0  HCT 49.2 49.0 45.6  MCV 100.6*  --  97.2  PLT 274  --  258    Basic Metabolic Panel:  Recent Labs  Lab 03/04/18 1014 03/04/18 1018 03/05/18 0418  NA 135 135 138  K 3.9 5.7* 3.5  CL 105 102 106  CO2 21*  --  24  GLUCOSE 103* 102* 101*  BUN 10 14 10   CREATININE 0.92 0.80 0.91  CALCIUM 9.5  --  9.1   Lipid Panel:     Component Value Date/Time   CHOL 164 03/05/2018 0418   TRIG 106 03/05/2018 0418   HDL 54 03/05/2018 0418   CHOLHDL 3.0 03/05/2018 0418   VLDL 21 03/05/2018 0418   LDLCALC 89 03/05/2018 0418   HgbA1c:  Lab Results  Component Value Date   HGBA1C 4.8 03/05/2018   Urine Drug Screen:     Component Value Date/Time   LABOPIA NONE DETECTED 03/05/2018 1127   COCAINSCRNUR NONE DETECTED 03/05/2018 1127   LABBENZ NONE DETECTED 03/05/2018 1127   AMPHETMU NONE DETECTED 03/05/2018 1127   THCU NONE DETECTED 03/05/2018 1127   LABBARB NONE DETECTED 03/05/2018 1127    Alcohol Level No results found for: ETH  IMAGING Ct Angio Head W Or Wo Contrast  Result Date: 03/04/2018 CLINICAL DATA:  Follow up code stroke after tPA. Acute onset LEFT-sided  weakness, facial droop and slurred speech now improving. EXAM: CT ANGIOGRAPHY HEAD AND NECK TECHNIQUE: Multidetector CT imaging of the head and neck was performed using the standard protocol during bolus administration of intravenous contrast. Multiplanar CT image reconstructions and MIPs were obtained to evaluate the vascular anatomy. Carotid stenosis measurements (when applicable) are obtained utilizing NASCET criteria, using the distal internal carotid diameter as the denominator. CONTRAST:  50 cc ISOVUE-370 IOPAMIDOL (ISOVUE-370) INJECTION 76% COMPARISON:  CT HEAD March 04, 2018 at 1023 hours. FINDINGS: CTA NECK FINDINGS: AORTIC ARCH: Normal appearance of the thoracic arch, normal branch pattern. The origins of the innominate, left Common carotid artery and subclavian artery are widely patent. RIGHT CAROTID SYSTEM: Common carotid artery is patent. Normal appearance of the carotid bifurcation without hemodynamically significant stenosis by NASCET criteria. Normal appearance of the internal carotid artery. LEFT CAROTID SYSTEM: Common carotid artery is patent. Normal appearance of the carotid bifurcation without hemodynamically significant stenosis by NASCET criteria. Normal appearance of the internal carotid artery. VERTEBRAL ARTERIES:RIGHT vertebral artery is dominant. Normal appearance of the vertebral arteries, widely patent.  SKELETON: No acute osseous process though bone windows have not been submitted. OTHER NECK: Soft tissues of the neck are nonacute though, not tailored for evaluation. Moderate to severe C6-7 spondylosis. UPPER CHEST: Included lung apices are clear. No superior mediastinal lymphadenopathy. CTA HEAD FINDINGS: ANTERIOR CIRCULATION: Patent cervical internal carotid arteries, petrous, cavernous and supra clinoid internal carotid arteries. Patent anterior communicating artery. Patent anterior and middle cerebral arteries. Mild luminal irregularity of the middle cerebral arteries and to lesser  extent anterior cerebral arteries compatible with atherosclerosis. No large vessel occlusion, significant stenosis, contrast extravasation or aneurysm. POSTERIOR CIRCULATION: Patent vertebral arteries, vertebrobasilar junction and basilar artery, as well as main branch vessels. Patent posterior cerebral arteries. Bilateral posterior communicating arteries present. Mild luminal irregularity posterior cerebral arteries compatible with atherosclerosis. No large vessel occlusion, significant stenosis, contrast extravasation or aneurysm. VENOUS SINUSES: Major dural venous sinuses are patent though not tailored for evaluation on this angiographic examination. ANATOMIC VARIANTS: None. DELAYED PHASE: Not performed. MIP images reviewed. IMPRESSION: CTA NECK: 1. Negative CTA NECK. CTA HEAD: 1. No emergent large vessel occlusion or flow-limiting stenosis. 2. Mild intracranial atherosclerosis. Electronically Signed   By: Awilda Metro M.D.   On: 03/04/2018 14:17   Ct Head Wo Contrast  Result Date: 03/05/2018 CLINICAL DATA:  Stroke follow-up.  Status post tPA EXAM: CT HEAD WITHOUT CONTRAST TECHNIQUE: Contiguous axial images were obtained from the base of the skull through the vertex without intravenous contrast. COMPARISON:  03/04/2018 FINDINGS: Brain: Suspect acute lacunar infarct in the right thalamus. Remote small vessel infarcts in the right putamen and right pons. No hemorrhage or hydrocephalus. Vascular: No hyperdense vessel. Skull: Negative Sinuses/Orbits: Negative IMPRESSION: 1. Suspect acute right thalamic lacunar infarct. 2. Remote lacunar infarcts in the right putamen and pons. 3. No hemorrhage after tPA. Electronically Signed   By: Marnee Spring M.D.   On: 03/05/2018 09:46   Ct Angio Neck W Or Wo Contrast  Result Date: 03/04/2018 CLINICAL DATA:  Follow up code stroke after tPA. Acute onset LEFT-sided weakness, facial droop and slurred speech now improving. EXAM: CT ANGIOGRAPHY HEAD AND NECK TECHNIQUE:  Multidetector CT imaging of the head and neck was performed using the standard protocol during bolus administration of intravenous contrast. Multiplanar CT image reconstructions and MIPs were obtained to evaluate the vascular anatomy. Carotid stenosis measurements (when applicable) are obtained utilizing NASCET criteria, using the distal internal carotid diameter as the denominator. CONTRAST:  50 cc ISOVUE-370 IOPAMIDOL (ISOVUE-370) INJECTION 76% COMPARISON:  CT HEAD March 04, 2018 at 1023 hours. FINDINGS: CTA NECK FINDINGS: AORTIC ARCH: Normal appearance of the thoracic arch, normal branch pattern. The origins of the innominate, left Common carotid artery and subclavian artery are widely patent. RIGHT CAROTID SYSTEM: Common carotid artery is patent. Normal appearance of the carotid bifurcation without hemodynamically significant stenosis by NASCET criteria. Normal appearance of the internal carotid artery. LEFT CAROTID SYSTEM: Common carotid artery is patent. Normal appearance of the carotid bifurcation without hemodynamically significant stenosis by NASCET criteria. Normal appearance of the internal carotid artery. VERTEBRAL ARTERIES:RIGHT vertebral artery is dominant. Normal appearance of the vertebral arteries, widely patent. SKELETON: No acute osseous process though bone windows have not been submitted. OTHER NECK: Soft tissues of the neck are nonacute though, not tailored for evaluation. Moderate to severe C6-7 spondylosis. UPPER CHEST: Included lung apices are clear. No superior mediastinal lymphadenopathy. CTA HEAD FINDINGS: ANTERIOR CIRCULATION: Patent cervical internal carotid arteries, petrous, cavernous and supra clinoid internal carotid arteries. Patent anterior communicating artery. Patent anterior and  middle cerebral arteries. Mild luminal irregularity of the middle cerebral arteries and to lesser extent anterior cerebral arteries compatible with atherosclerosis. No large vessel occlusion,  significant stenosis, contrast extravasation or aneurysm. POSTERIOR CIRCULATION: Patent vertebral arteries, vertebrobasilar junction and basilar artery, as well as main branch vessels. Patent posterior cerebral arteries. Bilateral posterior communicating arteries present. Mild luminal irregularity posterior cerebral arteries compatible with atherosclerosis. No large vessel occlusion, significant stenosis, contrast extravasation or aneurysm. VENOUS SINUSES: Major dural venous sinuses are patent though not tailored for evaluation on this angiographic examination. ANATOMIC VARIANTS: None. DELAYED PHASE: Not performed. MIP images reviewed. IMPRESSION: CTA NECK: 1. Negative CTA NECK. CTA HEAD: 1. No emergent large vessel occlusion or flow-limiting stenosis. 2. Mild intracranial atherosclerosis. Electronically Signed   By: Awilda Metro M.D.   On: 03/04/2018 14:17   Mr Brain Wo Contrast  Result Date: 03/05/2018 CLINICAL DATA:  LEFT-sided weakness. This began yesterday. Status post tPA. EXAM: MRI HEAD WITHOUT CONTRAST TECHNIQUE: Multiplanar, multiecho pulse sequences of the brain and surrounding structures were obtained without intravenous contrast. COMPARISON:  CT head 03/04/2018 and 03/05/2018.  No prior MR. FINDINGS: Brain: 5 mm focus of restricted diffusion, posterior limb internal capsule on the RIGHT, corresponding low ADC, consistent with acute infarction. No other areas of restricted diffusion. Chronic lacunar infarcts of the RIGHT thalamus, and RIGHT paramedian pons. Chronic posterior lentiform nucleus hemorrhage, lateral to the acute infarct, corresponds to an area of encephalomalacia on CT. No mass lesion, or extra-axial fluid. Mild atrophy, with hydrocephalus ex vacuo, premature for age. T2 and FLAIR hyperintensities in the subcortical and periventricular white matter, consistent with small vessel disease. Prominent perivascular spaces. Vascular: Flow voids are maintained. Skull and upper cervical  spine: Normal marrow signal. Upper cervical region unremarkable. Sinuses/Orbits: No acute findings. Other: None. IMPRESSION: Subcentimeter focus of restricted diffusion in the posterior limb internal capsule on the RIGHT consistent with acute infarction, posterior limb internal capsule. Areas of chronic lacunar infarction are seen in the brainstem, RIGHT lentiform nucleus, and RIGHT thalamus as described above. Mild premature atrophy.  Mild small vessel disease. Electronically Signed   By: Elsie Stain M.D.   On: 03/05/2018 10:57   Ct Head Code Stroke Wo Contrast  Result Date: 03/04/2018 CLINICAL DATA:  Code stroke. 47 year old male with sudden onset left side weakness, facial droop and slurred speech. EXAM: CT HEAD WITHOUT CONTRAST TECHNIQUE: Contiguous axial images were obtained from the base of the skull through the vertex without intravenous contrast. COMPARISON:  Head CT 12/23/2005. FINDINGS: Brain: Circumscribed oval hypodensity in the right lower pons on series 3 image 8 is new since 2007. There has been generalized cerebral volume loss since that time. Similar chronic appearing oval hypodensity in the posterior right lentiform on image 15 is new. No midline shift, ventriculomegaly, mass effect, evidence of mass lesion, intracranial hemorrhage or evidence of cortically based acute infarction. Vascular:  No suspicious intracranial vascular hyperdensity. Skull: Negative. Sinuses/Orbits: Increased anterior ethmoid and right frontal sinus mucosal thickening since 2007. Other Visualized paranasal sinuses and mastoids are stable and well pneumatized. Other: No acute orbit or scalp soft tissue findings. Postoperative changes to the right globe. ASPECTS [facility] Stroke Program Early CT Score) - Ganglionic level infarction (caudate, lentiform nuclei, internal capsule, insula, M1-M3 cortex): 7 - Supraganglionic infarction (M4-M6 cortex): 3 Total score (0-10 with 10 being normal): 10 IMPRESSION: 1. Age  indeterminate but probably chronic lacunar infarcts in the right lentiform and right pons. 2. No superimposed acute cortically based infarct or acute intracranial  hemorrhage. ASPECTS is 10. 3. These results were communicated to Dr. Laurence Slate at 10:33 amon 10/22/2019by text page via the [facility] messaging system. Electronically Signed   By: Odessa Fleming M.D.   On: 03/04/2018 10:34   2D Echocardiogram  - Left ventricle: Wall thickness was increased in a pattern of moderate LVH. Systolic function was mildly reduced. The estimated ejection fraction was in the range of 45% to 50%. Doppler parameters are consistent with abnormal left ventricular relaxation (grade 1 diastolic dysfunction).   PHYSICAL EXAM Constitutional: Appears well-developed and well-nourished, skin very tanned.  Psych: Affect flat  Eyes: No scleral injection HENT: No OP obstrucion Head: Normocephalic.  Cardiovascular: Normal rate and regular rhythm.  Respiratory: Effort normal, non-labored breathing Skin: WDI  Neuro: Mental Status: Patient is sleeping on arrival (ativan on call MRI this am), wakens to voice. Maintains awake state, alert, oriented to person, place, month, year, and situation. Patient is able to give a clear and coherent history. Patient is dysarthric Cranial Nerves: II: Visual Fields are full. Pupils are equal, round, and reactive to light.   III,IV, VI: EOMI without ptosis or diploplia.  V: decreased facial sensation on the L to touch and temperature VII: Facial movement is asymmetric on the left VIII: hearing is intact to voice X: Uvula elevates symmetrically XI: Shoulder shrug is symmetric. XII: tongue is midline without atrophy or fasciculations.  Motor: 5/5 all extremities, however does have a slight L arm and leg dirft. R arm orbits over L. Decreased FMM on the L  Sensory: Sensation decreased on the left arm. Sensation intact to the L leg Deep Tendon Reflexes: 2+ and symmetric in the biceps and patellae.   Plantars: Toes are downgoing bilaterally.  Cerebellar: FNF intact bilaterally   ASSESSMENT/PLAN Mr. BAILY SERPE is a 47 y.o. male with history of HTN, tobacco use and ETOH abuse presenting with elevated BP, L HP, decreased sensation L side, dysarthria. Received IV tPA 03/04/2018 at 1038.  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 Chronic R lentiform and R pontine infarcts. ASPECTS 10.     CTA head mild intracranial atherosclerosis   CTA neck negative  Post tPA CT suspect R thalamic lacune. Old lacunes R putamen and pons. No hmg MRI  subcentimeter R PLIC infarct, old lacunes R brainstem, R lentiform nucleus, and R thalamus. Small vessel disease. Premature Atrophy.   2D Echo  EF 45-50%. No source of embolus   LDL 89  HgbA1c 4.8  HIV negative   SCDs for VTE prophylaxis  No antithrombotic prior to admission, now on aspirin 81 mg daily. Given mild stroke, recommend aspirin 81 mg and plavix 75 mg daily x 3 weeks, then aspirin alone. Orders adjusted.   Therapy recommendations:  OP PT and OT. 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 Reassess at follow up for return to work. Advise no driving at this time.  Disposition:  Return home, family supportive.   Hypertensive Emergency  BP 222/135 on arrival  On cleviprex post tPA for BP control, off at 6p 03/04/2018  BP variable this am, over post tPA goal, received labetalol prn x 3 this am so far  Add norvasc 10 mg daily  . Long-term BP goal normotensive  Hyperlipidemia  Home meds:  No statin  LDL 89, goal < 70  Add lipitor 10  Continue statin at discharge  Other Stroke Risk Factors  Cigarette smoker, advised to stop  smoking  ETOH abuse, advised to drink no more than 2 drink(s) a day.Drinks up to 28 beers/day. On CIWA protocol.   UDS negative  Hospital day # 1  Annie Main, MSN, APRN, ANVP-BC, AGPCNP-BC Advanced Practice Stroke Nurse [facility]  [facility] Stroke Center See Amion for Schedule & Pager information 03/05/2018 1:58 PM   To contact Stroke Continuity provider, please refer to [email or web address]. After hours, contact General Neurology

## 2018-03-05 NOTE — Progress Notes (Signed)
  Echocardiogram 2D Echocardiogram has been performed.  Mason Decker 03/05/2018, 12:56 PM

## 2018-03-05 NOTE — Progress Notes (Signed)
Occupational Therapy Evaluation Patient Details Name: Mason Decker MRN: [id number] DOB: 04-23-1971 Today's Date: 03/05/2018    History of Present Illness 47 y.o. male who does smoke but has no other past medical history and has not seen a primary care physician. Pt admitted with weakness on his left side decreased sensation on his left side including dysarthria. CT -.  Given TpA. 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 Lives with girlfriend and has supportive family who can assist at DC. Pt presents with deficits listed below and will benefit from follow up with OT at the neuro outpt center. Will see again prior to DC to establish HEP.     Follow Up Recommendations  Outpatient OT;Supervision - Intermittent(neuro outpt)  Refrain from driving until cleared by MD   Equipment Recommendations  Tub/shower seat    Recommendations for Other Services Speech consult(cognitive linguistic eval)     Precautions / Restrictions Precautions Precautions: Fall      Mobility Bed Mobility Overal bed mobility: Needs Assistance Bed Mobility: Supine to Sit     Supine to sit: Supervision     General bed mobility comments: initial L lean; able to achieve midline  Transfers Overall transfer level: Needs assistance   Transfers: Sit to/from Stand;Stand Pivot Transfers Sit to Stand: Min guard Stand pivot transfers: Min guard       General transfer comment: increased unsteadiness with quick turns    Balance                                           ADL either performed or assessed with clinical judgement   ADL Overall ADL's : Needs assistance/impaired Eating/Feeding: Set up;Sitting   Grooming: Set up   Upper Body Bathing: Set up;Sitting   Lower Body Bathing: Set up;Supervison/ safety;Sit to/from stand   Upper Body  Dressing : Supervision/safety;Set up;Sitting   Lower Body Dressing: Set up;Supervision/safety Lower Body Dressing Details (indicate cue type and reason): assist for fasteners Toilet Transfer: Min guard;Ambulation   Toileting- Clothing Manipulation and Hygiene: Supervision/safety;Set up;Sit to/from stand       Functional mobility during ADLs: Min guard;Cueing for safety General ADL Comments: initially unsteady     Vision Baseline Vision/History: No visual deficits Vision Assessment?: No apparent visual deficits Additional Comments: Appears within functional limits     Perception Perception Comments: ? miild L inattention during functinoal activities; may be affect of ativan   Praxis Praxis Praxis tested?: Within functional limits    Pertinent Vitals/Pain Pain Assessment: No/denies pain     Hand Dominance Right   Extremity/Trunk Assessment Upper Extremity Assessment Upper Extremity Assessment: LUE deficits/detail LUE Deficits / Details: Brunstrom VI arm/hand; poor coordination/in-hand manipulation skills; requires cues to attempt to use LUE LUE Sensation: decreased light touch;decreased proprioception LUE Coordination: decreased fine motor;decreased gross motor   Lower Extremity Assessment Lower Extremity Assessment: Defer to PT evaluation   Cervical / Trunk Assessment Cervical / Trunk Assessment: Normal   Communication Communication Communication: Expressive difficulties("slurred speech")   Cognition Arousal/Alertness: Awake/alert Behavior During Therapy: Flat affect(Pt did have Ativan for MRI) Overall Cognitive Status: Impaired/Different from baseline Area of Impairment: Safety/judgement;Awareness  General Comments: Pt states his arm is "about normal", but has obvious difficulty with using LUE; requires cues to use LUE; ? mild L inattention   General Comments       Exercises Exercises: Other exercises Other  Exercises Other Exercises: encourage use of L hand   Shoulder Instructions      Home Living Family/patient expects to be discharged to:: Private residence Living Arrangements: Spouse/significant other Available Help at Discharge: Family;Available 24 hours/day Type of Home: House Home Access: Stairs to enter [employer] of Steps: 4 Entrance Stairs-Rails: Left Home Layout: One level     Bathroom Shower/Tub: Tub/shower unit;Walk-in shower   Bathroom Toilet: Standard Bathroom Accessibility: Yes How Accessible: Accessible via walker Home Equipment: Shower seat   Additional Comments: parents have shower seat and RW      Prior Functioning/Environment Level of Independence: Independent        Comments: works as Programmer, systems around gas pumps at [employer]        OT Problem List: Decreased strength;Decreased range of motion;Decreased activity tolerance;Impaired balance (sitting and/or standing);Impaired vision/perception;Decreased coordination;Decreased safety awareness;Decreased knowledge of use of DME or AE;Impaired sensation;Impaired UE functional use      OT Treatment/Interventions: Self-care/ADL training;Neuromuscular education;DME and/or AE instruction;Therapeutic activities;Patient/family education;Balance training    OT Goals(Current goals can be found in the care plan section) Acute Rehab OT Goals Patient Stated Goal: to go back to work OT Goal Formulation: With patient Time For Goal Achievement: 03/19/18 Potential to Achieve Goals: Good  OT Frequency: Min 2X/week   Barriers to D/C:            Co-evaluation              AM-PAC PT "6 Clicks" Daily Activity     Outcome Measure Help from another person eating meals?: None Help from another person taking care of personal grooming?: None Help from another person toileting, which includes using toliet, bedpan, or urinal?: None Help from another person bathing (including washing,  rinsing, drying)?: None Help from another person to put on and taking off regular upper body clothing?: None Help from another person to put on and taking off regular lower body clothing?: None 6 Click Score: 24   End of Session Equipment Utilized During Treatment: Gait belt Nurse Communication: Other (comment)(DC needs)  Activity Tolerance: Patient tolerated treatment well Patient left: Other (comment)(with PT)  OT Visit Diagnosis: Other abnormalities of gait and mobility (R26.89);Muscle weakness (generalized) (M62.81);Other symptoms and signs involving cognitive function;Hemiplegia and hemiparesis Hemiplegia - Right/Left: Left Hemiplegia - dominant/non-dominant: Non-Dominant Hemiplegia - caused by: Cerebral infarction                Time: 1335-1356 OT Time Calculation (min): 21 min Charges:  OT General Charges $OT Visit: 1 Visit OT Evaluation $OT Eval Moderate Complexity: 1 Mod  Gionni Vaca, OT/L   Acute OT Clinical Specialist Acute Rehabilitation Services Pager [phone] Office [phone]   [facility] 03/05/2018, 2:28 PM

## 2018-03-05 NOTE — Evaluation (Signed)
Physical Therapy Evaluation Patient Details Name: Mason Decker MRN: [id number] DOB: 11-29-1970 Today's Date: 03/05/2018   History of Present Illness  47 y.o. male who does smoke but has no other past medical history and has not seen a primary care physician. Pt admitted with weakness on his left side decreased sensation on his left side including dysarthria. CT -.  Given TpA. 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   Clinical Impression  Pt is mobilizing well with mild awareness deficits and some signs of mild left sided inattention.  He has some mild balance deficits related to coordination of his left leg.  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   PT to follow acutely for deficits listed below.      Follow Up Recommendations Outpatient PT;Other (comment)(neurorehab, gait balance, and work hardening)    Furniture conservator/restorer  None recommended by PT    Recommendations for Other Services   NA    Precautions / Restrictions Precautions Precautions: Fall Precaution Comments: some mild left neglect and discoordination L       Mobility  Bed Mobility               General bed mobility comments: NT, pt OOB with OT   Transfers Overall transfer level: Needs assistance   Transfers: Sit to/from Stand Sit to Stand: Min guard         General transfer comment: Min guard assist for safety and balance.   Ambulation/Gait Ambulation/Gait assistance: Min guard Gait Distance (Feet): 200 Feet Assistive device: None Gait Pattern/deviations: Step-through pattern;Ataxic     General Gait Details: Mildly discoordinated gait pattern on the left side, some signs of inattention to the left during gait, poor arm swing  Stairs Stairs: Yes Stairs assistance: Min guard Stair Management: One rail Right;Alternating pattern;Forwards Number of Stairs:  4 General stair comments: Coming down the steps pt had difficulty with left foot placement.   Wheelchair Mobility    Modified Rankin (Stroke Patients Only) Modified Rankin (Stroke Patients Only) Pre-Morbid Rankin Score: No symptoms Modified Rankin: Moderately severe disability     Balance Overall balance assessment: Needs assistance Sitting-balance support: Feet supported;No upper extremity supported Sitting balance-Leahy Scale: Good     Standing balance support: No upper extremity supported Standing balance-Leahy Scale: Good                   Standardized Balance Assessment Standardized Balance Assessment : Dynamic Gait Index   Dynamic Gait Index Level Surface: Mild Impairment Change in Gait Speed: Normal Gait with Horizontal Head Turns: Mild Impairment Gait with Vertical Head Turns: Normal Gait and Pivot Turn: Mild Impairment Step Over Obstacle: Mild Impairment Step Around Obstacles: Normal Steps: Mild Impairment Total Score: 19       Pertinent Vitals/Pain Pain Assessment: No/denies pain    Home Living Family/patient expects to be discharged to:: Private residence Living Arrangements: Spouse/significant other(girlfriend) Available Help at Discharge: Family;Available 24 hours/day Type of Home: House Home Access: Stairs to enter Entrance Stairs-Rails: Left Entrance Stairs-Number of Steps: 4 Home Layout: One level Home Equipment: Shower seat Additional Comments: parents have shower seat and RW    Prior Function Level of Independence: Independent         Comments: works as Programmer, systems around gas pumps at [employer]   Dominant Hand: Right  Extremity/Trunk Assessment   Upper Extremity Assessment Upper Extremity Assessment: Defer to OT evaluation    Lower Extremity Assessment Lower Extremity Assessment: LLE deficits/detail LLE Deficits / Details: strength via seated MMT WNL, coordination/speed of movement  mildly impaired and only seen with functional mobility, not heel to shin test LLE Sensation: WNL LLE Coordination: decreased gross motor    Cervical / Trunk Assessment Cervical / Trunk Assessment: Normal  Communication   Communication: Expressive difficulties(slurred speech)  Cognition Arousal/Alertness: Awake/alert Behavior During Therapy: Flat affect Overall Cognitive Status: Impaired/Different from baseline Area of Impairment: Safety/judgement;Awareness                         Safety/Judgement: Decreased awareness of safety;Decreased awareness of deficits Awareness: Emergent   General Comments: Pt seemingly slow to move and slow to process, RN did state that he had some Ativan earlier today to better tolerate MRI.       General Comments General comments (skin integrity, edema, etc.): Discussed "BEFAST" education with pt and family and provided with a handout.     Exercises Other Exercises Other Exercises: fine motor/coordination HEP Other Exercises:  red theraputty HEP Other Exercises: BUE integrated activities   Assessment/Plan    PT Assessment Patient needs continued PT services  PT Problem List Decreased balance;Decreased mobility;Decreased coordination;Decreased safety awareness;Decreased knowledge of precautions;Impaired sensation       PT Treatment Interventions DME instruction;Gait training;Stair training;Functional mobility training;Therapeutic activities;Therapeutic exercise;Balance training;Neuromuscular re-education;Cognitive remediation;Patient/family education    PT Goals (Current goals can be found in the Care Plan section)  Acute Rehab PT Goals Patient Stated Goal: to go back to work at [employer] PT Goal Formulation: With patient Time For Goal Achievement: 03/19/18 Potential to Achieve Goals: Good    Frequency Min 4X/week    AM-PAC PT "6 Clicks" Daily Activity  Outcome Measure Difficulty turning over in bed (including adjusting  bedclothes, sheets and blankets)?: A Little Difficulty moving from lying on back to sitting on the side of the bed? : A Little Difficulty sitting down on and standing up from a chair with arms (e.g., wheelchair, bedside commode, etc,.)?: Unable Help needed moving to and from a bed to chair (including a wheelchair)?: A Little Help needed walking in hospital room?: A Little Help needed climbing 3-5 steps with a railing? : A Little 6 Click Score: 16    End of Session   Activity Tolerance: Patient tolerated treatment well Patient left: in chair;with call bell/phone within reach;with family/visitor present Nurse Communication: Mobility status PT Visit Diagnosis: Unsteadiness on feet (R26.81);Difficulty in walking, not elsewhere classified (R26.2);Hemiplegia and hemiparesis Hemiplegia - Right/Left: Left Hemiplegia - dominant/non-dominant: Non-dominant Hemiplegia - caused by: Cerebral infarction    Time: 9811-9147 PT Time Calculation (min) (ACUTE ONLY): 18 min   Charges:        Lurena Joiner B. Ceil Roderick, PT, DPT  Acute Rehabilitation [phone] pager #[phone] office   PT Evaluation $PT Eval Low Complexity: 1 Low

## 2018-03-05 NOTE — Care Management Note (Signed)
Case Management Note  Patient Details  Name: Mason Decker MRN: [id number] Date of Birth: 11-30-1970  Subjective/Objective:                  47 y.o. male who does smoke but has no other past medical history and has not seen a primary care physician. Pt admitted with weakness on his left side decreased sensation on his left side including dysarthria.  Pt s/p TPA.  PTA, pt independent, lives with girlfriend.  Action/Plan: PT/OT recommending OP follow up, and pt agreeable.  Family at bedside states they can assist with transportation to therapy appointments.  Referrals to OP PT/OT placed in Epic; rehab center will call pt for appointments.  Expected Discharge Date:  03/05/18               Expected Discharge Plan:  OP Rehab  In-House Referral:     Discharge planning Services  CM Consult  Post Acute Care Choice:    Choice offered to:     DME Arranged:    DME Agency:     HH Arranged:    HH Agency:     Status of Service:  Completed, signed off  If discussed at [employer] of Stay Meetings, dates discussed:    Additional Comments:  Glennon Mac, RN 03/05/2018, 5:16 PM

## 2018-03-05 NOTE — Discharge Summary (Addendum)
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 No pertinent past medical history. History reviewed. No pertinent surgical history.  Allergies as of 03/05/2018   No Known Allergies     Medication List    STOP taking these medications   BC FAST PAIN RELIEF PO     TAKE these medications   amLODipine 10 MG tablet Commonly known as:  NORVASC Take 1 tablet (10 mg total) by mouth daily. Start taking on:  03/06/2018   aspirin 81 MG EC tablet Take 1 tablet (81 mg total) by mouth daily. Start taking on:  03/06/2018   atorvastatin 10 MG tablet Commonly known as:  LIPITOR Take 1 tablet (10 mg total) by mouth daily at 6 PM.   clopidogrel 75 MG tablet Commonly known as:  PLAVIX Take 1 tablet (75 mg total) by mouth daily.       LABORATORY STUDIES CBC    Component Value Date/Time   WBC 8.2 03/05/2018 0418   RBC 4.69 03/05/2018 0418   HGB 15.0 03/05/2018 0418   HCT 45.6 03/05/2018 0418   PLT 258 03/05/2018 0418   MCV 97.2 03/05/2018 0418   MCH 32.0 03/05/2018 0418   MCHC 32.9 03/05/2018 0418   RDW 11.9 03/05/2018 0418   LYMPHSABS 2.8 03/04/2018 1014   MONOABS 0.8 03/04/2018 1014   EOSABS 0.4 03/04/2018 1014   BASOSABS 0.1 03/04/2018 1014   CMP    Component Value Date/Time   NA 138 03/05/2018 0418   K 3.5 03/05/2018 0418   CL 106 03/05/2018 0418   CO2 24 03/05/2018 0418   GLUCOSE 101 (H) 03/05/2018 0418   BUN 10 03/05/2018 0418   CREATININE 0.91 03/05/2018 0418   CALCIUM 9.1 03/05/2018  0418   PROT 5.9 (L) 03/05/2018 0418   ALBUMIN 3.6 03/05/2018 0418   AST 14 (L) 03/05/2018 0418   ALT 14 03/05/2018 0418   ALKPHOS 38 03/05/2018 0418   BILITOT 0.6 03/05/2018 0418   GFRNONAA >60 03/05/2018 0418   GFRAA >60 03/05/2018 0418   COAGS Lab Results  Component Value Date   INR 1.12 03/04/2018   INR 1.0 06/11/2007   Lipid Panel    Component Value Date/Time   CHOL 164 03/05/2018 0418   TRIG 106 03/05/2018 0418   HDL 54 03/05/2018 0418   CHOLHDL 3.0 03/05/2018 0418   VLDL 21 03/05/2018 0418   LDLCALC 89 03/05/2018 0418   HgbA1C  Lab Results  Component Value Date   HGBA1C 4.8 03/05/2018   Urinalysis    Component Value Date/Time   COLORURINE YELLOW 06/11/2007 1645   APPEARANCEUR CLEAR 06/11/2007 1645   LABSPEC 1.004 (L) 06/11/2007 1645   PHURINE 8.0 06/11/2007 1645   GLUCOSEU NEGATIVE 06/11/2007 1645   HGBUR NEGATIVE 06/11/2007 1645   BILIRUBINUR NEGATIVE 06/11/2007 1645   KETONESUR TRACE (A) 06/11/2007 1645   PROTEINUR NEGATIVE 06/11/2007 1645   UROBILINOGEN 1.0 06/11/2007 1645   NITRITE NEGATIVE 06/11/2007 1645   LEUKOCYTESUR  06/11/2007 1645    NEGATIVE MICROSCOPIC NOT DONE ON URINES WITH NEGATIVE PROTEIN, BLOOD, LEUKOCYTES, NITRITE, OR GLUCOSE <1000 mg/dL.   Urine Drug Screen     Component Value Date/Time   LABOPIA NONE DETECTED 03/05/2018 1127   COCAINSCRNUR NONE DETECTED 03/05/2018 1127   LABBENZ NONE DETECTED 03/05/2018 1127   AMPHETMU NONE DETECTED 03/05/2018 1127   THCU NONE DETECTED 03/05/2018 1127   LABBARB NONE DETECTED 03/05/2018 1127    Alcohol Level No results found for: [facility]   SIGNIFICANT DIAGNOSTIC STUDIES Ct Angio Head W Or Wo Contrast  Result Date: 03/04/2018 CLINICAL DATA:  Follow up code stroke after tPA. Acute onset LEFT-sided weakness, facial droop and slurred speech now improving. EXAM: CT ANGIOGRAPHY HEAD AND NECK TECHNIQUE: Multidetector CT imaging of the head and neck was performed using the standard protocol during bolus  administration of intravenous contrast. Multiplanar CT image reconstructions and MIPs were obtained to evaluate the vascular anatomy. Carotid stenosis measurements (when applicable) are obtained utilizing NASCET criteria, using the distal internal carotid diameter as the denominator. CONTRAST:  50 cc ISOVUE-370 IOPAMIDOL (ISOVUE-370) INJECTION 76% COMPARISON:  CT HEAD March 04, 2018 at 1023 hours. FINDINGS: CTA NECK FINDINGS: AORTIC ARCH: Normal appearance of the thoracic arch, normal branch pattern. The origins of the innominate, left Common carotid artery and subclavian artery are widely patent. RIGHT CAROTID SYSTEM: Common carotid artery is patent. Normal appearance of the carotid bifurcation without hemodynamically significant stenosis by NASCET criteria. Normal appearance of the internal carotid artery. LEFT CAROTID SYSTEM: Common carotid artery is patent. Normal appearance of the carotid bifurcation without hemodynamically significant stenosis by NASCET criteria. Normal appearance of the internal carotid artery. VERTEBRAL ARTERIES:RIGHT vertebral artery is dominant. Normal appearance of the vertebral arteries, widely patent. SKELETON: No acute osseous process though bone windows have not been submitted. OTHER NECK: Soft tissues of the neck are nonacute though, not tailored for evaluation. Moderate to severe C6-7 spondylosis. UPPER CHEST: Included lung apices are clear. No superior mediastinal lymphadenopathy. CTA HEAD FINDINGS: ANTERIOR CIRCULATION: Patent cervical internal carotid arteries, petrous, cavernous and supra clinoid internal carotid arteries. Patent anterior communicating artery. Patent anterior and middle cerebral arteries. Mild luminal irregularity of the middle cerebral arteries and to lesser extent anterior cerebral arteries compatible with atherosclerosis. No large vessel occlusion, significant stenosis, contrast extravasation or aneurysm. POSTERIOR CIRCULATION: Patent vertebral arteries,  vertebrobasilar junction and basilar artery, as well as main branch vessels. Patent posterior cerebral arteries. Bilateral posterior communicating arteries present. Mild luminal irregularity posterior cerebral arteries compatible with atherosclerosis. No large vessel occlusion, significant stenosis, contrast extravasation or aneurysm. VENOUS SINUSES: Major dural venous sinuses are patent though not tailored for evaluation on this angiographic examination. ANATOMIC VARIANTS: None. DELAYED PHASE: Not performed. MIP images reviewed. IMPRESSION: CTA NECK: 1. Negative CTA NECK. CTA HEAD: 1. No emergent large vessel occlusion or flow-limiting stenosis. 2. Mild intracranial atherosclerosis. Electronically Signed   By: Awilda Metro M.D.   On: 03/04/2018 14:17   Ct Head Wo Contrast  Result Date: 03/05/2018 CLINICAL DATA:  Stroke follow-up.  Status post tPA EXAM: CT HEAD WITHOUT CONTRAST TECHNIQUE: Contiguous axial images were obtained from the base of the skull through the vertex without intravenous contrast. COMPARISON:  03/04/2018 FINDINGS: Brain: Suspect acute lacunar infarct in the right thalamus. Remote small vessel infarcts in the right putamen and right pons. No hemorrhage or hydrocephalus. Vascular: No hyperdense vessel. Skull: Negative Sinuses/Orbits: Negative IMPRESSION: 1. Suspect acute right thalamic lacunar infarct. 2. Remote lacunar infarcts in the right putamen  and pons. 3. No hemorrhage after tPA. Electronically Signed   By: Marnee Spring M.D.   On: 03/05/2018 09:46   Ct Angio Neck W Or Wo Contrast  Result Date: 03/04/2018 CLINICAL DATA:  Follow up code stroke after tPA. Acute onset LEFT-sided weakness, facial droop and slurred speech now improving. EXAM: CT ANGIOGRAPHY HEAD AND NECK TECHNIQUE: Multidetector CT imaging of the head and neck was performed using the standard protocol during bolus administration of intravenous contrast. Multiplanar CT image reconstructions and MIPs were obtained  to evaluate the vascular anatomy. Carotid stenosis measurements (when applicable) are obtained utilizing NASCET criteria, using the distal internal carotid diameter as the denominator. CONTRAST:  50 cc ISOVUE-370 IOPAMIDOL (ISOVUE-370) INJECTION 76% COMPARISON:  CT HEAD March 04, 2018 at 1023 hours. FINDINGS: CTA NECK FINDINGS: AORTIC ARCH: Normal appearance of the thoracic arch, normal branch pattern. The origins of the innominate, left Common carotid artery and subclavian artery are widely patent. RIGHT CAROTID SYSTEM: Common carotid artery is patent. Normal appearance of the carotid bifurcation without hemodynamically significant stenosis by NASCET criteria. Normal appearance of the internal carotid artery. LEFT CAROTID SYSTEM: Common carotid artery is patent. Normal appearance of the carotid bifurcation without hemodynamically significant stenosis by NASCET criteria. Normal appearance of the internal carotid artery. VERTEBRAL ARTERIES:RIGHT vertebral artery is dominant. Normal appearance of the vertebral arteries, widely patent. SKELETON: No acute osseous process though bone windows have not been submitted. OTHER NECK: Soft tissues of the neck are nonacute though, not tailored for evaluation. Moderate to severe C6-7 spondylosis. UPPER CHEST: Included lung apices are clear. No superior mediastinal lymphadenopathy. CTA HEAD FINDINGS: ANTERIOR CIRCULATION: Patent cervical internal carotid arteries, petrous, cavernous and supra clinoid internal carotid arteries. Patent anterior communicating artery. Patent anterior and middle cerebral arteries. Mild luminal irregularity of the middle cerebral arteries and to lesser extent anterior cerebral arteries compatible with atherosclerosis. No large vessel occlusion, significant stenosis, contrast extravasation or aneurysm. POSTERIOR CIRCULATION: Patent vertebral arteries, vertebrobasilar junction and basilar artery, as well as main branch vessels. Patent posterior cerebral  arteries. Bilateral posterior communicating arteries present. Mild luminal irregularity posterior cerebral arteries compatible with atherosclerosis. No large vessel occlusion, significant stenosis, contrast extravasation or aneurysm. VENOUS SINUSES: Major dural venous sinuses are patent though not tailored for evaluation on this angiographic examination. ANATOMIC VARIANTS: None. DELAYED PHASE: Not performed. MIP images reviewed. IMPRESSION: CTA NECK: 1. Negative CTA NECK. CTA HEAD: 1. No emergent large vessel occlusion or flow-limiting stenosis. 2. Mild intracranial atherosclerosis. Electronically Signed   By: Awilda Metro M.D.   On: 03/04/2018 14:17   Mr Brain Wo Contrast  Result Date: 03/05/2018 CLINICAL DATA:  LEFT-sided weakness. This began yesterday. Status post tPA. EXAM: MRI HEAD WITHOUT CONTRAST TECHNIQUE: Multiplanar, multiecho pulse sequences of the brain and surrounding structures were obtained without intravenous contrast. COMPARISON:  CT head 03/04/2018 and 03/05/2018.  No prior MR. FINDINGS: Brain: 5 mm focus of restricted diffusion, posterior limb internal capsule on the RIGHT, corresponding low ADC, consistent with acute infarction. No other areas of restricted diffusion. Chronic lacunar infarcts of the RIGHT thalamus, and RIGHT paramedian pons. Chronic posterior lentiform nucleus hemorrhage, lateral to the acute infarct, corresponds to an area of encephalomalacia on CT. No mass lesion, or extra-axial fluid. Mild atrophy, with hydrocephalus ex vacuo, premature for age. T2 and FLAIR hyperintensities in the subcortical and periventricular white matter, consistent with small vessel disease. Prominent perivascular spaces. Vascular: Flow voids are maintained. Skull and upper cervical spine: Normal marrow signal. Upper cervical region unremarkable.  Sinuses/Orbits: No acute findings. Other: None. IMPRESSION: Subcentimeter focus of restricted diffusion in the posterior limb internal capsule on the  RIGHT consistent with acute infarction, posterior limb internal capsule. Areas of chronic lacunar infarction are seen in the brainstem, RIGHT lentiform nucleus, and RIGHT thalamus as described above. Mild premature atrophy.  Mild small vessel disease. Electronically Signed   By: Elsie Stain M.D.   On: 03/05/2018 10:57   Ct Head Code Stroke Wo Contrast  Result Date: 03/04/2018 CLINICAL DATA:  Code stroke. 47 year old male with sudden onset left side weakness, facial droop and slurred speech. EXAM: CT HEAD WITHOUT CONTRAST TECHNIQUE: Contiguous axial images were obtained from the base of the skull through the vertex without intravenous contrast. COMPARISON:  Head CT 12/23/2005. FINDINGS: Brain: Circumscribed oval hypodensity in the right lower pons on series 3 image 8 is new since 2007. There has been generalized cerebral volume loss since that time. Similar chronic appearing oval hypodensity in the posterior right lentiform on image 15 is new. No midline shift, ventriculomegaly, mass effect, evidence of mass lesion, intracranial hemorrhage or evidence of cortically based acute infarction. Vascular:  No suspicious intracranial vascular hyperdensity. Skull: Negative. Sinuses/Orbits: Increased anterior ethmoid and right frontal sinus mucosal thickening since 2007. Other Visualized paranasal sinuses and mastoids are stable and well pneumatized. Other: No acute orbit or scalp soft tissue findings. Postoperative changes to the right globe. ASPECTS [facility] Stroke Program Early CT Score) - Ganglionic level infarction (caudate, lentiform nuclei, internal capsule, insula, M1-M3 cortex): 7 - Supraganglionic infarction (M4-M6 cortex): 3 Total score (0-10 with 10 being normal): 10 IMPRESSION: 1. Age indeterminate but probably chronic lacunar infarcts in the right lentiform and right pons. 2. No superimposed acute cortically based infarct or acute intracranial hemorrhage. ASPECTS is 10. 3. These results were communicated  to Dr. Laurence Slate at 10:33 amon 10/22/2019by text page via the [facility] messaging system. Electronically Signed   By: Odessa Fleming M.D.   On: 03/04/2018 10:34    2D Echocardiogram  Left ventricle: Wall thickness was increased in a pattern ofmoderate LVH. Systolic function was mildly reduced. The estimatedejection fraction was in the range of 45% to 50%. Dopplerparameters are consistent with abnormal left ventricularrelaxation (grade 1 diastolic dysfunction).    HISTORY OF PRESENT ILLNESS HERALD VALLIN is a 47 y.o. male who does smoke but has no other past medical history and has not seen a primary care physician.  Patient noted at around 9 AM this morning 03/04/2018 that he had weakness on his left side decreased sensation on his left side including dysarthria.  Apparently he got to work and his coworkers noted this also.  Blood pressures were taken while at work and all showed that his blood pressures were systolically in the 200s and diastolically in the 100s.  Patient did not have any blurred vision, flaccidity, expressive or receptive aphasia.  It is unclear what his blood pressures are at baseline.  As stated patient takes no medication and/or aspirin. While in the ED patient was rushed to the CT scan, there was no acute bleed on CT and patient was offered TPA.  Patient was immediately given TPA after Cleviprex was given to get the blood pressure down to 170 systolically and 105 diastolically. Premorbid modified Rankin scale (mRS):0 . He was admitted to the ICU for further evaluation and treatment.   HOSPITAL COURSE Mr. MACEDONIO SCALLON is a 47 y.o. male with history of HTN, tobacco use and ETOH abuse presenting with elevated BP, L HP,  decreased sensation L side, dysarthria. Received IV tPA 03/04/2018 at 1038.  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 Chronic R lentiform and R pontine infarcts. ASPECTS 10.     CTA head mild intracranial  atherosclerosis   CTA neck negative  Post tPA CT suspect R thalamic lacune. Old lacunes R putamen and pons. No hmg MRI  subcentimeter R PLIC infarct, old lacunes R brainstem, R lentiform nucleus, and R thalamus. Small vessel disease. Premature Atrophy.   2D Echo  EF 45-50%. No source of embolus   LDL 89  HgbA1c 4.8  HIV negative   No antithrombotic prior to admission, Given mild stroke, recommend aspirin 81 mg and plavix 75 mg daily x 3 weeks, then aspirin alone.   Therapy recommendations:  OP PT and OT. 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 Advise no driving at this time. Reassess at follow up for return to work and driving.  Disposition:  Return home, family supportive. sister given contact information for FMLA paperwork.  Hypertensive Emergency  BP 222/135 on arrival  On cleviprex post tPA for BP control, off at 6p 03/04/2018  BP variable this am, over post tPA goal, received labetalol prn x 3 this am so far  Added norvasc 10 mg daily   Long-term BP goal normotensive  Needs close PCP follow up  Hyperlipidemia  Home meds:  No statin  LDL 89, goal < 70  Added lipitor 10  Continue statin at discharge  Follow up with PCP  Other Stroke Risk Factors  Cigarette smoker, advised to stop smoking  ETOH abuse, advised to drink no more than 2 drink(s) a day.Drinks up to 28 beers/day. On CIWA protocol.   UDS negative  DISCHARGE EXAM Blood pressure (!) 208/118, pulse 84, temperature 98.3 F (36.8 C), temperature source Oral, resp. rate 14, height 5\' 10"  (1.778 m), weight 88.2 kg, SpO2 99 %. Constitutional: Appears well-developed and well-nourished, skin very tanned.  Psych: Affect flat  Eyes: No scleral injection HENT: No OP obstrucion Head: Normocephalic.  Cardiovascular: Normal rate and regular rhythm.  Respiratory: Effort normal, non-labored breathing Skin: WDI  Neuro: Mental Status: Patient is sleeping on arrival (ativan on call MRI this am),  wakens to voice. Maintains awake state, alert, oriented to person, place, month, year, and situation. Patient is able to give a clear and coherent history. Patient is dysarthric Cranial Nerves: II: Visual Fields are full. Pupils are equal, round, and reactive to light.  III,IV, VI: EOMI without ptosis or diploplia.  V: decreased facial sensation on the L to touch and temperature VII: Facial movement isasymmetric on the left VIII: hearing is intact to voice X: Uvula elevates symmetrically XI: Shoulder shrug is symmetric. XII: tongue is midline without atrophy or fasciculations.  Motor: 5/5 all extremities to direct exam, however does have a L arm and leg dirft. R arm orbits over L. Decreased FMM on the L  Sensory: Sensation decreased on the left arm. Sensation intact to the L leg. Some inattention on the L Deep Tendon Reflexes: 2+ and symmetric in the biceps and patellae.  Plantars: Toes are downgoing bilaterally.  Cerebellar: FNF intact bilaterally  Discharge Diet   Heart healthy thin liquids  DISCHARGE PLAN  Disposition:  Return home  Outpatient PT and OT  No work at this time. Therapy/PCP to assess as pt progresses  No driving for now  aspirin 81 mg daily and clopidogrel 75 mg  daily for secondary stroke prevention x 3 weeks then aspirin alone  Ongoing risk factor control by Primary Care Physician at time of discharge  Follow-up PCP  in 2 weeks. Patient and family advised to get one.  Follow-up in [facility] in 4 weeks, office to schedule an appointment.   40 minutes were spent preparing discharge.  Annie Main, MSN, APRN, ANVP-BC, AGPCNP-BC Advanced Practice Stroke Nurse [facility] See Amion for Schedule & Pager information 03/05/2018 2:48 PM    ATTENDING NOTE: I reviewed above note and agree with the assessment and plan. Pt was seen and examined.   47 year old male with history of hypertension, smoker, heavy  alcohol user admitted for left-sided weakness numbness, slurred speech.  CT no acute abnormality.  Found to have a high blood pressure ER.  TPA given after BP under control.  CTA head and neck unremarkable.  MRI showed acute right PLIC infarct, and also old lacunar infarcts at brainstem, right BG and right thalamus.  EF 45 to 50%.  LDL 89 and A1c 4.8.  UDS negative.  Patient stroke consistent with small vessel disease given significant sent risk factors.  PT/OT recommend outpatient PT/OT.  Currently on aspirin 81 and Plavix 75 for 3 weeks and then Plavix alone.  Continue low-dose statin for stroke prevention.  Had prolonged discussion with patient and family regarding stroke risk factor modification including quit smoking, limit alcohol use, monitor BP at home, close follow-up with PCP, and compliant with medication.  Patient is ready for discharge.  He will follow-up with stroke clinic in [place] in about 4 weeks.  He will also follow-up with PT/OT regarding back to work schedule and recommendations.  Marvel Plan, MD PhD Stroke Neurology 03/05/2018 5:12 PM

## 2018-03-05 NOTE — Progress Notes (Signed)
OT Treatment Note  Seen to establish LUE HEP. Completed family education. Pt to follow up with OT at the neuro outpt center.     03/05/18 1600  OT Visit Information  Last OT Received On 03/05/18  Assistance Needed +1  History of Present Illness 47 y.o. male who does smoke but has no other past medical history and has not seen a primary care physician. Pt admitted with weakness on his left side decreased sensation on his left side including dysarthria. CT -.  Given TpA. 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   Precautions  Precautions Fall  Pain Assessment  Pain Assessment No/denies pain  Cognition  Arousal/Alertness Awake/alert  Behavior During Therapy Flat affect  Overall Cognitive Status Impaired/Different from baseline  Area of Impairment Safety/judgement;Awareness  Safety/Judgement Decreased awareness of safety;Decreased awareness of deficits  Awareness Emergent  General Comments Pt waling out of bathroom with hospital gown dragging on his L arm and under his L foot. Decreased awareness.   ADL  General ADL Comments Educated caregiver on need for pt to complete ADL; educated on compensatory strategies for UE weakness  General Comments  General comments (skin integrity, edema, etc.) Discussed home safety adn recommendation to refrain from driving; cooking @ stove/grill and use of power tools  Other Exercises  Other Exercises fine motor/coordination HEP  Other Exercises  red theraputty HEP  Other Exercises BUE integrated activities  OT - End of Session  Activity Tolerance Patient tolerated treatment well  Patient left in chair;with family/visitor present  Nurse Communication Other (comment) (DC needs)  OT Assessment/Plan  OT Plan Discharge plan remains appropriate  OT Visit Diagnosis Other abnormalities of gait and mobility (R26.89);Muscle weakness (generalized) (M62.81);Other symptoms and signs involving cognitive function;Hemiplegia and  hemiparesis  Hemiplegia - Right/Left Left  Hemiplegia - dominant/non-dominant Non-Dominant  Hemiplegia - 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 4  Help from another person taking care of personal grooming? 4  Help from another person toileting, which includes using toliet, bedpan, or urinal? 4  Help from another person bathing (including washing, rinsing, drying)? 4  Help from another person to put on and taking off regular upper body clothing? 4  Help from another person to put on and taking off regular lower body clothing? 4  6 Click Score 24  ADL G Code Conversion CH  Acute Rehab OT Goals  Patient Stated Goal to go back to work  OT Goal Formulation With patient  Time For Goal Achievement 03/19/18  Potential to Achieve Goals Good  OT Time Calculation  OT Start Time (ACUTE ONLY) 1615  OT Stop Time (ACUTE ONLY) 1633  OT Time Calculation (min) 18 min  OT General Charges  $OT Visit 1 Visit  OT Treatments  $Therapeutic Activity 8-22 mins  Luisa Dago, OT/L   Acute OT Clinical Specialist Acute Rehabilitation Services Pager [phone] Office [phone]

## 2018-03-10 ENCOUNTER — Telehealth: Payer: Self-pay | Admitting: General Practice

## 2018-03-11 ENCOUNTER — Other Ambulatory Visit: Payer: Self-pay

## 2018-03-11 NOTE — Patient Outreach (Addendum)
[facility]) Care Management  03/11/2018  Mason Decker Oct 23, 1970 [id number]   EMMI: stroke red alert Referral date: 03/10/18 Referral reason: scheduled follow up appointment: no Insurance: Blue cross / blue shield Day # 1 Attempt #1  Telephone call to patient regarding referral. Unable to reach patient or leave voice mail message. . Message states voice mail has not been set up.   PLAN: RNCM will attempt 2nd telephone call to patient within 4 business days. RNCM will send outreach letter.   George Ina RN,BSN, CCM [facility] Telephonic  [phone]

## 2018-03-13 ENCOUNTER — Telehealth: Payer: Self-pay | Admitting: Adult Health

## 2018-03-13 NOTE — Telephone Encounter (Signed)
revised 

## 2018-03-13 NOTE — Telephone Encounter (Signed)
NP Kenyon Ana [phone] [facility] in [place] has called asking to speak with NP Shanda Bumps re: clinical information about pt.  NP Gevena Barre is aware that NP Shanda Bumps has not seen pt yet, please call

## 2018-03-13 NOTE — Telephone Encounter (Signed)
Revised. 

## 2018-03-13 NOTE — Telephone Encounter (Addendum)
Rn call Mason Schmidt NP at [facility] [place]. Mason Decker stated pt was just discharge from the hospital for a stroke. He stated pt had a lot of questions about his discharge. He stated pt wanted to know about his work issues. Rn stated Mason Bumps NP has not seen pt before. This will be her first time seeing pt. Pt was only seen by the MD neuro doctors at [facility]. Mason Decker wanted to know about blood pressure parameters. Rn stated typically handle the blood pressure, and cholesterol management. RN ask was pt on blood pressure before the stroke. Mason Decker stated pt was not on any blood pressure medications before and have elevated. RN stated we ask that PCP evaluate pt work capability until seen by our office. Rn stated pt has appt schedule with Mason Bumps NP later this month. RN also stated pt cannot drive per hospital discharge notes. Mason Decker verbalized understanding, and will call us if he has any questions. His number is [phone].

## 2018-03-14 ENCOUNTER — Other Ambulatory Visit: Payer: Self-pay

## 2018-03-14 NOTE — Patient Outreach (Signed)
[facility]) Care Management  03/14/2018  Mason Decker December 18, 1970 [id number]  EMMI: stroke red alert Referral date: 03/10/18 Referral reason: scheduled follow up appointment: no Insurance: Blue cross / blue shield Day # 1  Telephone call to patient regarding referral. Unable to reach patient or leave voice message.  Message states voice mail box has not been set up.    PLAN: RNCM will attempt 3rd telephone call to patient within 4 business days.   George Ina RN,BSN, CCM [facility] Telephonic  [phone]

## 2018-03-18 ENCOUNTER — Other Ambulatory Visit: Payer: Self-pay

## 2018-03-18 NOTE — Patient Outreach (Signed)
[facility]) Care Management  03/18/2018  Mason Decker November 13, 1970 [id number]  EMMI:stroke red alert Referral date:03/10/18 Referral reason:scheduled follow up appointment: no Insurance: Blue cross / blue shield Day #1 Attempt #3  Telephone call to patient regarding referral. Unable to reach patient or leave voice message.  Message states voice mail box has not been set up.    PLAN: If no return call will proceed with closure.    George Ina RN,BSN, CCM [facility] Telephonic  [phone]

## 2018-03-25 ENCOUNTER — Other Ambulatory Visit: Payer: Self-pay

## 2018-03-25 NOTE — Patient Outreach (Signed)
[facility]) Care Management  03/25/2018  Mason Decker April 01, 1971 [id number]  EMMI:stroke red alert Referral date:03/10/18 Referral reason:scheduled follow up appointment: no Insurance: Blue cross / blue shield Day #1  No response after 3 telephone calls and outreach letter attempt.  PLAN: RNCM will close patient due to being unable to reach.    George [name] RN,BSN,CCM [facility] Telephonic  [phone]

## 2018-04-01 NOTE — Progress Notes (Addendum)
[facility] [address] [place]. [place] [address] [phone]       OFFICE FOLLOW UP NOTE  Mr. Mason Decker Date of Birth:  02-Nov-1970 Medical Record Number:  [id number]   Reason for Referral:  hospital stroke follow up  CHIEF COMPLAINT:  Chief Complaint  Patient presents with  . Hospitalization Follow-up    Hospital follow up. Father present. Rm 9. Patient stated that when he had just got home from work his BP was 197/120, he mentioned that his face and head felt hot.    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 History obtained from patient, father and chart review. Reviewed all radiology images and labs personally.  Mr.Mason W Williamsis a 47 y.o.malewith history of HTN, tobacco use and ETOH abusepresenting with elevated BP, L HP, decreased sensation L side, dysarthria. 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  Received IV tPA 03/04/2018 at 1038.  CTA head showed mild intracranial atherosclerosis.  CTA neck negative.  Post TPA CT head reviewed and showed right thalamic lacunar infarct.  MRI brain reviewed and showed subcentimeter right PLICA infarct, old looking right brainstem infarct, right lentiform nucleus and right thalamic infarct along with small vessel disease and premature atrophy.  2D echo showed an EF of 45 to 50% without cardiac source of embolus identified.  LDL 89 and A1c 4.8.  Patient is on antithrombotic PTA and recommended DAPT for 3 weeks and aspirin alone.  Blood pressure initially on arrival to 22/135 but stabilized through admission along with adding amlodipine 10 mg daily and recommended close PCP follow-up for HTN management.  Initiated atorvastatin 10 mg daily.  Current tobacco use with smoking cessation counseling provided.  Extensive EtOH abuse with  approximately 28 beers per day.  Patient was discharged home in stable condition with recommendations of outpatient PT/OT.  Patient is being seen today for hospital follow-up and is accompanied by his father.  Overall he has been recovering well without residual deficit.  He has returned to work approximately 2 weeks ago without complication.  He has returned to all other prior activities.  He has completed 3-week DAPT with aspirin and Plavix and continues on aspirin 81 mg alone without side effects of bleeding or bruising.  Continues to take atorvastatin 10 mg daily without side effects of myalgias.  Blood pressure today 150/95.  He continues to be followed by his PCP for management and was started on benazepril 5 mg daily along with continuation of amlodipine 10 mg daily.  He does continue to monitor blood pressure at home and typically 150s/90s but does state after he returned home from work last night he started having symptoms of his face and neck feeling hot therefore he checked his blood pressure and was at 197/120.  He took a dose of his benazepril and normalized soon after.  He does have a follow-up appointment with his PCP this Friday.  He has decreased amount of tobacco use smoking 1 cigarette/day along along with decreasing his amount of daily alcohol.  No further concerns at this time.  Denies new or worsening stroke/TIA symptoms.    ROS:   14 system review of systems performed and negative with exception of weight gain, loss of vision, constipation, rash, and headache  PMH: History reviewed. No pertinent past medical history.  PSH:  Past Surgical History:  Procedure Laterality Date  . NO PAST SURGERIES      Social History:  Social History   Socioeconomic History  . Marital status: Divorced    Spouse name: Not on file  . Number of children: Not on file  . Years of education: Not on file  . Highest education level: Not on file  Occupational History    Employer: [employer]  Social  Needs  . Financial resource strain: Not on file  . Food insecurity:    Worry: Not on file    Inability: Not on file  . Transportation needs:    Medical: Not on file    Non-medical: Not on file  Tobacco Use  . Smoking status: Current Every Day Smoker  . Smokeless tobacco: Never Used  Substance and Sexual Activity  . Alcohol use: Yes    Alcohol/week: 168.0 standard drinks    Types: 168 Cans of beer per week    Comment: one case of beer per day  . Drug use: Not on file  . Sexual activity: Not on file  Lifestyle  . Physical activity:    Days per week: 5 days    Minutes per session: Not on file  . Stress: To some extent  Relationships  . Social connections:    Talks on phone: Not on file    Gets together: Not on file    Attends religious service: Not on file    Active member of club or organization: Not on file    Attends meetings of clubs or organizations: Not on file    Relationship status: Not on file  . Intimate partner violence:    Fear of current or ex partner: Not on file    Emotionally abused: Not on file    Physically abused: Not on file    Forced sexual activity: Not on file  Other Topics Concern  . Not on file  Social History Narrative  . Not on file    Family History:  Family History  Problem Relation Age of Onset  . Hypertension Mother   . Hypertension Father     Medications:   Current Outpatient Medications on File Prior to Visit  Medication Sig Dispense Refill  . amLODipine (NORVASC) 10 MG tablet Take 1 tablet (10 mg total) by mouth daily. 30 tablet 2  . aspirin EC 81 MG EC tablet Take 1 tablet (81 mg total) by mouth daily.    Marland Kitchen atorvastatin (LIPITOR) 10 MG tablet Take 1 tablet (10 mg total) by mouth daily at 6 PM. 30 tablet 2  . benazepril (LOTENSIN) 5 MG tablet Take 5 mg by mouth daily.  0   No current facility-administered medications on file prior to visit.     Allergies:  No Known Allergies   Physical Exam  Vitals:   04/02/18 0753  BP:  (!) 150/95  Pulse: 76  Weight: 194 lb 9.6 oz (88.3 kg)  Height: 5\' 10"  (1.778 m)   Body mass index is 27.92 kg/m. No exam data present  General: well developed, well nourished, pleasant middle-age Caucasian male, seated, in no evident distress Head: head normocephalic and atraumatic.   Neck: supple with no carotid or supraclavicular bruits Cardiovascular: regular rate and rhythm, no murmurs Musculoskeletal: no deformity Skin:  no rash/petichiae Vascular:  Normal pulses all extremities  Neurologic Exam Mental Status: Awake and fully alert. Oriented to place and time. Recent and remote memory intact. Attention span, concentration and fund of knowledge appropriate. Mood  and affect appropriate.  Cranial Nerves: Fundoscopic exam reveals sharp disc margins. Pupils equal, briskly reactive to light. Extraocular movements full without nystagmus. Visual fields full to confrontation. Hearing intact. Facial sensation intact. Face, tongue, palate moves normally and symmetrically.  Motor: Normal bulk and tone. Normal strength in all tested extremity muscles. Sensory.: intact to touch , pinprick , position and vibratory sensation.  Coordination: Rapid alternating movements normal in all extremities. Finger-to-nose and heel-to-shin performed accurately bilaterally.  Decreased left hand finger dexterity.  Orbits right arm over left arm. Gait and Station: Arises from chair without difficulty. Stance is normal. Gait demonstrates normal stride length and balance. Able to heel, toe and tandem walk without difficulty.  Reflexes: 1+ and symmetric. Toes downgoing.    NIHSS  0 Modified Rankin  1    Diagnostic Data (Labs, Imaging, Testing)  CT HEAD WO CONTRAST 03/04/2018 IMPRESSION: 1. Age indeterminate but probably chronic lacunar infarcts in the right lentiform and right pons. 2. No superimposed acute cortically based infarct or acute intracranial hemorrhage. ASPECTS is 10.  CT ANGIO HEAD W OR WO  CONTRAST CT ANGIO NECK W OR WO CONTRAST 03/04/2018 IMPRESSION: CTA NECK: 1. Negative CTA NECK. CTA HEAD: 1. No emergent large vessel occlusion or flow-limiting stenosis. 2. Mild intracranial atherosclerosis.  CT HEAD WO CONTRAST Post TPA 03/05/2018 IMPRESSION: 1. Suspect acute right thalamic lacunar infarct. 2. Remote lacunar infarcts in the right putamen and pons. 3. No hemorrhage after tPA.  MR BRAIN WO CONTRAST 03/05/2018 IMPRESSION: Subcentimeter focus of restricted diffusion in the posterior limb internal capsule on the RIGHT consistent with acute infarction, posterior limb internal capsule. Areas of chronic lacunar infarction are seen in the brainstem, RIGHT lentiform nucleus, and RIGHT thalamus as described above. Mild premature atrophy.  Mild small vessel disease.  ECHOCARDIOGRAM 03/05/18 Study Conclusions - Left ventricle: Wall thickness was increased in a pattern of   moderate LVH. Systolic function was mildly reduced. The estimated   ejection fraction was in the range of 45% to 50%. Doppler   parameters are consistent with abnormal left ventricular   relaxation (grade 1 diastolic dysfunction).    ASSESSMENT: Mason Decker is a 47 y.o. 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 Vascular risk factors include uncontrolled HTN, HLD tobacco use and ETOH use.  Patient is being seen today for hospital follow-up and has recovered well without subjective deficits but during exam, has mild residual left hand dexterity weakness.    PLAN:  1. Right PLICA infarct : Continue aspirin 81 mg daily  and lipitor 10mg   for secondary stroke prevention.  Request PCP to prescribe future refills.  Maintain strict control of hypertension with blood pressure goal below 130/90, diabetes with hemoglobin A1c goal below 6.5% and cholesterol with LDL cholesterol (bad cholesterol) goal below 70 mg/dL.  I also advised the patient to eat a  healthy diet with plenty of whole grains, cereals, fruits and vegetables, exercise regularly with at least 30 minutes of continuous activity daily and maintain ideal body weight. 2. HTN: Advised to continue current treatment regimen.  Today's BP 150/95.  Advised to continue to monitor at home along with continued follow-up with PCP for management.  Advised patient that if he does experience elevation in his blood pressure with symptoms such as headache, visual changes or lightheadedness to proceed to ED 3. HLD: Advised to continue current treatment regimen along with continued follow-up with PCP for future prescribing and monitoring of lipid panel  4. Tobacco/alcohol use: Educated on importance of cessation for secondary stroke prevention along with overall generalized health    Follow up in 6 months or call earlier if needed   Greater than 50% of time during this 25 minute visit was spent on counseling, explanation of diagnosis of right PICA infarct, reviewing risk factor management of HLD, HTN, tobacco and alcohol use, planning of further management along with potential future management, and discussion with patient and family answering all questions.    George Hugh, AGNP-BC  [facility] [address] [place], [place] [address]  Phone [phone] Fax [phone] Note: This document was prepared with digital dictation and possible smart phrase technology. Any transcriptional errors that result from this process are unintentional.

## 2018-04-02 ENCOUNTER — Encounter: Payer: Self-pay | Admitting: Adult Health

## 2018-04-02 ENCOUNTER — Ambulatory Visit: Payer: BLUE CROSS/BLUE SHIELD | Admitting: Adult Health

## 2018-04-02 VITALS — BP 150/95 | HR 76 | Ht 70.0 in | Wt 194.6 lb

## 2018-04-02 DIAGNOSIS — I10 Essential (primary) hypertension: Secondary | ICD-10-CM

## 2018-04-02 DIAGNOSIS — E785 Hyperlipidemia, unspecified: Secondary | ICD-10-CM

## 2018-04-02 DIAGNOSIS — I63311 Cerebral infarction due to thrombosis of right middle cerebral artery: Secondary | ICD-10-CM | POA: Diagnosis not present

## 2018-04-02 DIAGNOSIS — F17200 Nicotine dependence, unspecified, uncomplicated: Secondary | ICD-10-CM | POA: Diagnosis not present

## 2018-04-02 NOTE — Patient Instructions (Signed)
Continue aspirin 81 mg daily  and lipitor  for secondary stroke prevention  Continue to follow up with PCP regarding cholesterol and blood pressure management   Continue to stay active and do hand exercises   Continue to monitor blood pressure at home  Maintain strict control of hypertension with blood pressure goal below 130/90, diabetes with hemoglobin A1c goal below 6.5% and cholesterol with LDL cholesterol (bad cholesterol) goal below 70 mg/dL. I also advised the patient to eat a healthy diet with plenty of whole grains, cereals, fruits and vegetables, exercise regularly and maintain ideal body weight.  Followup in the future with me in 6 months or call earlier if needed       [facility]. I hope we have been able to provide you high quality care today.  You may receive a patient satisfaction survey over the next few weeks. We would appreciate your feedback and comments so that we may continue to improve ourselves and the health of our patients.

## 2018-04-02 NOTE — Progress Notes (Signed)
I agree with the above plan 

## 2018-05-20 ENCOUNTER — Other Ambulatory Visit: Payer: Self-pay

## 2018-05-20 NOTE — Patient Outreach (Signed)
First attempt to obtain mRs. No answer. Voicemail has not been set up.

## 2018-05-29 ENCOUNTER — Other Ambulatory Visit: Payer: Self-pay

## 2018-05-29 NOTE — Patient Outreach (Signed)
Telephone outreach to patient to obtain mRs was successfully completed. mRs= 0.  Spoke with mother (on [place]) to obtain score as I could not get in contact with the patient himself.

## 2018-07-25 NOTE — Telephone Encounter (Signed)
Closing chart due to no call back from pt

## 2018-10-01 ENCOUNTER — Ambulatory Visit: Payer: BLUE CROSS/BLUE SHIELD | Admitting: Adult Health

## 2018-11-04 NOTE — Progress Notes (Signed)
Please place orders in EPIC as patient has a pre-op appointment on 11/05/2018! Thank you!

## 2018-11-04 NOTE — Progress Notes (Addendum)
04/03/2019- noted in [place]- Office visit with Venancio Poisson, NP for Neurology  03/05/2018- noted in Epic- EKG and ECHO

## 2018-11-04 NOTE — Patient Instructions (Signed)
Mason Decker  11/04/2018   Your procedure is scheduled on: Wednesday 11/12/2018  Report to [facility] Main  Entrance              Report to  Short Stay at [place] [facility] TEST ON_______ @_______ , THIS TEST MUST BE DONE BEFORE SURGERY, COME TO [place].             ONCE YOUR COVID TEST IS COMPLETED, PLEASE BEGIN THE QUARANTINE INSTRUCTIONS AS OUTLINED IN YOUR HANDOUT.   Call this number if you have problems the morning of surgery [phone]    Remember: Do not eat food or drink liquids :After Midnight.               BRUSH YOUR TEETH MORNING OF SURGERY AND RINSE YOUR MOUTH OUT, NO CHEWING GUM CANDY OR MINTS.     Take these medicines the morning of surgery with A SIP OF WATER: Amlodipine (Norvasc)                                 You may not have any metal on your body including hair pins and              piercings  Do not wear jewelry, make-up, lotions, powders or perfumes, deodorant                        Men may shave face and neck.   Do not bring valuables to the hospital. [place].  Contacts, dentures or bridgework may not be worn into surgery.  Leave suitcase in the car. After surgery it may be brought to your room.                  Please read over the following fact sheets you were given: _____________________________________________________________________             [facility] - Preparing for Surgery Before surgery, you can play an important role.  Because skin is not sterile, your skin needs to be as free of germs as possible.  You can reduce the number of germs on your skin by washing with CHG (chlorahexidine gluconate) soap before surgery.  CHG is an antiseptic cleaner which kills germs and bonds with the skin to continue killing germs even after washing. Please DO NOT use if you have an allergy to CHG or  antibacterial soaps.  If your skin becomes reddened/irritated stop using the CHG and inform your nurse when you arrive at Short Stay. Do not shave (including legs and underarms) for at least 48 hours prior to the first CHG shower.  You may shave your face/neck. Please follow these instructions carefully:  1.  Shower with CHG Soap the night before surgery and the  morning of Surgery.  2.  If you choose to wash your hair, wash your hair first as usual with your  normal  shampoo.  3.  After you shampoo, rinse your hair and body thoroughly to remove the  shampoo.  4.  Use CHG as you would any other liquid soap.  You can apply chg directly  to the skin and wash                       Gently with a scrungie or clean washcloth.  5.  Apply the CHG Soap to your body ONLY FROM THE NECK DOWN.   Do not use on face/ open                           Wound or open sores. Avoid contact with eyes, ears mouth and genitals (private parts).                       Wash face,  Genitals (private parts) with your normal soap.             6.  Wash thoroughly, paying special attention to the area where your surgery  will be performed.  7.  Thoroughly rinse your body with warm water from the neck down.  8.  DO NOT shower/wash with your normal soap after using and rinsing off  the CHG Soap.                9.  Pat yourself dry with a clean towel.            10.  Wear clean pajamas.            11.  Place clean sheets on your bed the night of your first shower and do not  sleep with pets. Day of Surgery : Do not apply any lotions/deodorants the morning of surgery.  Please wear clean clothes to the hospital/surgery center.  FAILURE TO FOLLOW THESE INSTRUCTIONS MAY RESULT IN THE CANCELLATION OF YOUR SURGERY PATIENT SIGNATURE_________________________________  NURSE SIGNATURE__________________________________  ________________________________________________________________________   Mason Decker  An incentive spirometer is a tool that can help keep your lungs clear and active. This tool measures how well you are filling your lungs with each breath. Taking long deep breaths may help reverse or decrease the chance of developing breathing (pulmonary) problems (especially infection) following:  A long period of time when you are unable to move or be active. BEFORE THE PROCEDURE   If the spirometer includes an indicator to show your best effort, your nurse or respiratory therapist will set it to a desired goal.  If possible, sit up straight or lean slightly forward. Try not to slouch.  Hold the incentive spirometer in an upright position. INSTRUCTIONS FOR USE  1. Sit on the edge of your bed if possible, or sit up as far as you can in bed or on a chair. 2. Hold the incentive spirometer in an upright position. 3. Breathe out normally. 4. Place the mouthpiece in your mouth and seal your lips tightly around it. 5. Breathe in slowly and as deeply as possible, raising the piston or the ball toward the top of the column. 6. Hold your breath for 3-5 seconds or for as long as possible. Allow the piston or ball to fall to the bottom of the column. 7. Remove the mouthpiece from your mouth and breathe out normally. 8. Rest for a few seconds and repeat Steps 1 through 7 at least 10 times every 1-2 hours when you are awake. Take your time and take a few normal breaths between deep breaths. 9. The spirometer may include an indicator to show  your best effort. Use the indicator as a goal to work toward during each repetition. 10. After each set of 10 deep breaths, practice coughing to be sure your lungs are clear. If you have an incision (the cut made at the time of surgery), support your incision when coughing by placing a pillow or rolled up towels firmly against it. Once you are able to get out of bed, walk around indoors and cough well. You may stop using the incentive spirometer when  instructed by your caregiver.  RISKS AND COMPLICATIONS  Take your time so you do not get dizzy or light-headed.  If you are in pain, you may need to take or ask for pain medication before doing incentive spirometry. It is harder to take a deep breath if you are having pain. AFTER USE  Rest and breathe slowly and easily.  It can be helpful to keep track of a log of your progress. Your caregiver can provide you with a simple table to help with this. If you are using the spirometer at home, follow these instructions: [place] IF:   You are having difficultly using the spirometer.  You have trouble using the spirometer as often as instructed.  Your pain medication is not giving enough relief while using the spirometer.  You develop fever of 100.5 F (38.1 C) or higher. SEEK IMMEDIATE MEDICAL CARE IF:   You cough up bloody sputum that had not been present before.  You develop fever of 102 F (38.9 C) or greater.  You develop worsening pain at or near the incision site. MAKE SURE YOU:   Understand these instructions.  Will watch your condition.  Will get help right away if you are not doing well or get worse. Document Released: 09/10/2006 Document Revised: 07/23/2011 Document Reviewed: 11/11/2006 [facility] Patient Information 2014 [place], [place].   ________________________________________________________________________

## 2018-11-05 ENCOUNTER — Other Ambulatory Visit: Payer: Self-pay

## 2018-11-05 ENCOUNTER — Encounter (HOSPITAL_COMMUNITY)
Admission: RE | Admit: 2018-11-05 | Discharge: 2018-11-05 | Disposition: A | Discharge status: Home / Self Care | Payer: BC Managed Care – PPO | Source: Ambulatory Visit | Attending: Orthopedic Surgery | Admitting: Orthopedic Surgery

## 2018-11-06 NOTE — Patient Instructions (Addendum)
Mason Decker Mason Decker  11/06/2018   Your procedure is scheduled on: Wednesday 11/19/2018  Report [facility] Main  Entrance             Report to  Short Stay at  0630 AM              YOU NEED TO HAVE A COVID 19 TEST ON_7/6_____ @___9 :30____, THIS TEST MUST BE DONE BEFORE SURGERY, [facility].            ONCE YOUR COVID TEST IS COMPLETED, PLEASE BEGIN THE QUARANTINE INSTRUCTIONS AS OUTLINED IN YOUR HANDOUT.   Call this number if you have problems the morning of surgery [phone]    Remember: Do not eat food or drink liquids :After Midnight.                NO SOLID FOOD AFTER MIDNIGHT THE NIGHT PRIOR TO SURGERY. NOTHING BY MOUTH EXCEPT CLEAR LIQUIDS UNTIL 3 HOURS PRIOR TO SCHEULED SURGERY.             PLEASE FINISH ENSURE DRINK PER SURGEON ORDER 3 HOURS PRIOR TO SCHEDULED SURGERY TIME WHICH NEEDS TO BE COMPLETED AT _ 0530 am.   CLEAR LIQUID DIET   Foods Allowed                                                                     Foods Excluded  Coffee and tea, regular and decaf                             liquids that you cannot  Plain Jell-O in any flavor                                             see through such as: Fruit ices (not with fruit pulp)                                     milk, soups, orange juice  Iced Popsicles                                    All solid food Carbonated beverages, regular and diet                                    Cranberry, grape and apple juices Sports drinks like Gatorade Lightly seasoned clear broth or consume(fat free) Sugar, honey syrup  Sample Menu Breakfast                                Lunch  Supper Cranberry juice                    Beef broth                            Chicken broth Jell-O                                     Grape juice                           Apple juice Coffee or tea                        Jell-O                                       Popsicle                                                Coffee or tea                        Coffee or tea  _____________________________________________________________________               BRUSH YOUR TEETH MORNING OF SURGERY AND RINSE YOUR MOUTH OUT, NO CHEWING GUM CANDY OR MINTS.     Take these medicines the morning of surgery with A SIP OF WATER: Amlodipine (Norvasc)                                 You may not have any metal on your body including hair pins and              piercings  Do not wear jewelry, make-up, lotions, powders or perfumes, deodorant             Men may shave face and neck.   Do not bring valuables to the hospital. [facility] IS NOT             RESPONSIBLE   FOR VALUABLES.  Contacts, dentures or bridgework may not be worn into surgery.  Leave suitcase in the car. After surgery it may be brought to your room.                   Please read over the following fact sheets you were given: _____________________________________________________________________             [facility] - Preparing for Surgery Before surgery, you can play an important role.  Because skin is not sterile, your skin needs to be as free of germs as possible.  You can reduce the number of germs on your skin by washing with CHG (chlorahexidine gluconate) soap before surgery.  CHG is an antiseptic cleaner which kills germs and bonds with the skin to continue killing germs even after washing. Please DO NOT use if you have an allergy to CHG or antibacterial soaps.  If your skin becomes reddened/irritated stop using the CHG and inform your nurse when you  arrive at Short Stay. Do not shave (including legs and underarms) for at least 48 hours prior to the first CHG shower.  You may shave your face/neck. Please follow these instructions carefully:  1.  Shower with CHG Soap the night before surgery and the  morning of Surgery.  2.  If you choose to wash your hair, wash  your hair first as usual with your  normal  shampoo.  3.  After you shampoo, rinse your hair and body thoroughly to remove the  shampoo.                           4.  Use CHG as you would any other liquid soap.  You can apply chg directly  to the skin and wash                       Gently with a scrungie or clean washcloth.  5.  Apply the CHG Soap to your body ONLY FROM THE NECK DOWN.   Do not use on face/ open                           Wound or open sores. Avoid contact with eyes, ears mouth and genitals (private parts).                       Wash face,  Genitals (private parts) with your normal soap.             6.  Wash thoroughly, paying special attention to the area where your surgery  will be performed.  7.  Thoroughly rinse your body with warm water from the neck down.  8.  DO NOT shower/wash with your normal soap after using and rinsing off  the CHG Soap.                9.  Pat yourself dry with a clean towel.            10.  Wear clean pajamas.            11.  Place clean sheets on your bed the night of your first shower and do not  sleep with pets. Day of Surgery : Do not apply any lotions/deodorants the morning of surgery.  Please wear clean clothes to the hospital/surgery center.  FAILURE TO FOLLOW THESE INSTRUCTIONS MAY RESULT IN THE CANCELLATION OF YOUR SURGERY PATIENT SIGNATURE_________________________________  NURSE SIGNATURE__________________________________  ________________________________________________________________________   Mason MireIncentive Spirometer  An incentive spirometer is a tool that can help keep your lungs clear and active. This tool measures how well you are filling your lungs with each breath. Taking long deep breaths may help reverse or decrease the chance of developing breathing (pulmonary) problems (especially infection) following:  A long period of time when you are unable to move or be active. BEFORE THE PROCEDURE   If the spirometer includes an  indicator to show your best effort, your nurse or respiratory therapist will set it to a desired goal.  If possible, sit up straight or lean slightly forward. Try not to slouch.  Hold the incentive spirometer in an upright position. INSTRUCTIONS FOR USE  1. Sit on the edge of your bed if possible, or sit up as far as you can in bed or on a chair. 2. Hold the incentive spirometer in an upright position. 3. Breathe out normally.  4. Place the mouthpiece in your mouth and seal your lips tightly around it. 5. Breathe in slowly and as deeply as possible, raising the piston or the ball toward the top of the column. 6. Hold your breath for 3-5 seconds or for as long as possible. Allow the piston or ball to fall to the bottom of the column. 7. Remove the mouthpiece from your mouth and breathe out normally. 8. Rest for a few seconds and repeat Steps 1 through 7 at least 10 times every 1-2 hours when you are awake. Take your time and take a few normal breaths between deep breaths. 9. The spirometer may include an indicator to show your best effort. Use the indicator as a goal to work toward during each repetition. 10. After each set of 10 deep breaths, practice coughing to be sure your lungs are clear. If you have an incision (the cut made at the time of surgery), support your incision when coughing by placing a pillow or rolled up towels firmly against it. Once you are able to get out of bed, walk around indoors and cough well. You may stop using the incentive spirometer when instructed by your caregiver.  RISKS AND COMPLICATIONS  Take your time so you do not get dizzy or light-headed.  If you are in pain, you may need to take or ask for pain medication before doing incentive spirometry. It is harder to take a deep breath if you are having pain. AFTER USE  Rest and breathe slowly and easily.  It can be helpful to keep track of a log of your progress. Your caregiver can provide you with a simple table  to help with this. If you are using the spirometer at home, follow these instructions: SEEK MEDICAL CARE IF:   You are having difficultly using the spirometer.  You have trouble using the spirometer as often as instructed.  Your pain medication is not giving enough relief while using the spirometer.  You develop fever of 100.5 F (38.1 C) or higher. SEEK IMMEDIATE MEDICAL CARE IF:   You cough up bloody sputum that had not been present before.  You develop fever of 102 F (38.9 C) or greater.  You develop worsening pain at or near the incision site. MAKE SURE YOU:   Understand these instructions.  Will watch your condition.  Will get help right away if you are not doing well or get worse. Document Released: 09/10/2006 Document Revised: 07/23/2011 Document Reviewed: 11/11/2006 [employer] Patient Information 2014 [employer], [place].   ________________________________________________________________________  WHAT IS A BLOOD TRANSFUSION? Blood Transfusion Information  A transfusion is the replacement of blood or some of its parts. Blood is made up of multiple cells which provide different functions.  Red blood cells carry oxygen and are used for blood loss replacement.  White blood cells fight against infection.  Platelets control bleeding.  Plasma helps clot blood.  Other blood products are available for specialized needs, such as hemophilia or other clotting disorders. BEFORE THE TRANSFUSION  Who gives blood for transfusions?   Healthy volunteers who are fully evaluated to make sure their blood is safe. This is blood bank blood. Transfusion therapy is the safest it has ever been in the practice of medicine. Before blood is taken from a donor, a complete history is taken to make sure that person has no history of diseases nor engages in risky social behavior (examples are intravenous drug use or sexual activity with multiple partners). The donor's travel history is  screened  to minimize risk of transmitting infections, such as malaria. The donated blood is tested for signs of infectious diseases, such as HIV and hepatitis. The blood is then tested to be sure it is compatible with you in order to minimize the chance of a transfusion reaction. If you or a relative donates blood, this is often done in anticipation of surgery and is not appropriate for emergency situations. It takes many days to process the donated blood. RISKS AND COMPLICATIONS Although transfusion therapy is very safe and saves many lives, the main dangers of transfusion include:   Getting an infectious disease.  Developing a transfusion reaction. This is an allergic reaction to something in the blood you were given. Every precaution is taken to prevent this. The decision to have a blood transfusion has been considered carefully by your caregiver before blood is given. Blood is not given unless the benefits outweigh the risks. AFTER THE TRANSFUSION  Right after receiving a blood transfusion, you will usually feel much better and more energetic. This is especially true if your red blood cells have gotten low (anemic). The transfusion raises the level of the red blood cells which carry oxygen, and this usually causes an energy increase.  The nurse administering the transfusion will monitor you carefully for complications. HOME CARE INSTRUCTIONS  No special instructions are needed after a transfusion. You may find your energy is better. Speak with your caregiver about any limitations on activity for underlying diseases you may have. SEEK MEDICAL CARE IF:   Your condition is not improving after your transfusion.  You develop redness or irritation at the intravenous (IV) site. SEEK IMMEDIATE MEDICAL CARE IF:  Any of the following symptoms occur over the next 12 hours:  Shaking chills.  You have a temperature by mouth above 102 F (38.9 C), not controlled by medicine.  Chest, back, or muscle  pain.  People around you feel you are not acting correctly or are confused.  Shortness of breath or difficulty breathing.  Dizziness and fainting.  You get a rash or develop hives.  You have a decrease in urine output.  Your urine turns a dark color or changes to pink, red, or brown. Any of the following symptoms occur over the next 10 days:  You have a temperature by mouth above 102 F (38.9 C), not controlled by medicine.  Shortness of breath.  Weakness after normal activity.  The white part of the eye turns yellow (jaundice).  You have a decrease in the amount of urine or are urinating less often.  Your urine turns a dark color or changes to pink, red, or brown. Document Released: 04/27/2000 Document Revised: 07/23/2011 Document Reviewed: 12/15/2007 [facility] Patient Information 2014 [place], [place].  _______________________________________________________________________

## 2018-11-12 ENCOUNTER — Other Ambulatory Visit: Payer: Self-pay

## 2018-11-12 ENCOUNTER — Ambulatory Visit (HOSPITAL_COMMUNITY)
Admission: RE | Admit: 2018-11-12 | Discharge: 2018-11-12 | Disposition: A | Discharge status: Home / Self Care | Payer: BC Managed Care – PPO | Source: Ambulatory Visit | Attending: Surgical | Admitting: Surgical

## 2018-11-12 ENCOUNTER — Encounter (HOSPITAL_COMMUNITY): Payer: Self-pay

## 2018-11-12 ENCOUNTER — Encounter (HOSPITAL_COMMUNITY)
Admission: RE | Admit: 2018-11-12 | Discharge: 2018-11-12 | Disposition: A | Discharge status: Home / Self Care | Payer: BC Managed Care – PPO | Source: Ambulatory Visit | Attending: Orthopedic Surgery | Admitting: Orthopedic Surgery

## 2018-11-12 DIAGNOSIS — M545 Low back pain, unspecified: Secondary | ICD-10-CM

## 2018-11-12 HISTORY — DX: Cerebral infarction, unspecified: I63.9

## 2018-11-12 HISTORY — DX: Essential (primary) hypertension: I10

## 2018-11-12 LAB — COMPREHENSIVE METABOLIC PANEL
ALT: 28 U/L (ref 0–44)
AST: 19 U/L (ref 15–41)
Albumin: 4.3 g/dL (ref 3.5–5.0)
Alkaline Phosphatase: 38 U/L (ref 38–126)
Anion gap: 8 (ref 5–15)
BUN: 13 mg/dL (ref 6–20)
CO2: 27 mmol/L (ref 22–32)
Calcium: 9.4 mg/dL (ref 8.9–10.3)
Chloride: 103 mmol/L (ref 98–111)
Creatinine, Ser: 0.7 mg/dL (ref 0.61–1.24)
GFR calc Af Amer: >60 mL/min (ref >60)
GFR calc non Af Amer: >60 mL/min (ref >60)
Glucose, Bld: 119 mg/dL — ABNORMAL HIGH (ref 70–99)
Potassium: 4.1 mmol/L (ref 3.5–5.1)
Sodium: 138 mmol/L (ref 135–145)
Total Bilirubin: 0.6 mg/dL (ref 0.3–1.2)
Total Protein: 7.2 g/dL (ref 6.5–8.1)

## 2018-11-12 LAB — CBC WITH DIFFERENTIAL/PLATELET
Abs Immature Granulocytes: 0.03 10*3/uL (ref 0.00–0.07)
Basophils Absolute: 0.1 10*3/uL (ref 0.0–0.1)
Basophils Relative: 1 %
Eosinophils Absolute: 0.3 10*3/uL (ref 0.0–0.5)
Eosinophils Relative: 3 %
HCT: 47.3 % (ref 39.0–52.0)
Hemoglobin: 15.3 g/dL (ref 13.0–17.0)
Immature Granulocytes: 0 %
Lymphocytes Relative: 15 %
Lymphs Abs: 1.4 10*3/uL (ref 0.7–4.0)
MCH: 33 pg (ref 26.0–34.0)
MCHC: 32.3 g/dL (ref 30.0–36.0)
MCV: 102.2 fL — ABNORMAL HIGH (ref 80.0–100.0)
Monocytes Absolute: 0.8 10*3/uL (ref 0.1–1.0)
Monocytes Relative: 8 %
Neutro Abs: 6.9 10*3/uL (ref 1.7–7.7)
Neutrophils Relative %: 73 %
Platelets: 327 10*3/uL (ref 150–400)
RBC: 4.63 MIL/uL (ref 4.22–5.81)
RDW: 11.9 % (ref 11.5–15.5)
WBC: 9.4 10*3/uL (ref 4.0–10.5)
nRBC: 0 % (ref 0.0–0.2)

## 2018-11-12 LAB — PROTIME-INR
INR: 1 (ref 0.8–1.2)
Prothrombin Time: 13.4 seconds (ref 11.4–15.2)

## 2018-11-12 LAB — APTT: aPTT: 28 seconds (ref 24–36)

## 2018-11-12 LAB — SURGICAL PCR SCREEN
MRSA, PCR: NEGATIVE
Staphylococcus aureus: NEGATIVE

## 2018-11-12 LAB — ABO/RH: ABO/RH(D): A POS

## 2018-11-12 NOTE — Progress Notes (Signed)
Mason Felix PA  Patient was instructed to stop his ASA on 11/14/18 by the pharmasist. His PCP managed his medications at this time  Dr. Venancio Poisson prescribed them after his stroke in Oct. 2019 EKG is in Epic. Labs in [facility]

## 2018-11-13 NOTE — Progress Notes (Signed)
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 [name].    Pt last s/p Stroked 03/04/2018.  Followed up with neurologist, last seen 04/02/2018.  2D echo showed an EF of 45 to 50% without cardiac source of embolus identified.  Has completed DAPT, now on ASA alone.  Advised to follow up in 6 months at that visit.    Clearance received from PCP, Sharlyne Cai, NP, 10/24/2018 which states pt is cleared for surgery with the following recommendations: pt does have a h/o CVA without deficit; HTN is currently controlled; h/o ETOH abuse."  Anticipate pt can proceed with planned procedure barring acute status change.   VS: BP 134/88 (BP Location: Left Arm)   Pulse 86   Temp 36.9 C (Oral)   Resp 18   Ht 5\' 10"  (1.778 m)   Wt 85.4 kg   SpO2 100%   BMI 27.02 kg/m   PROVIDERS: Adaline Sill, NP is PCP   Antony Contras, MD is Neurologist  LABS: Labs reviewed: Acceptable for surgery. (all labs ordered are listed, but only abnormal results are displayed)  Labs Reviewed  CBC WITH DIFFERENTIAL/PLATELET - Abnormal; Notable for the following components:      Result Value   MCV 102.2 (*)    All other components within normal limits  COMPREHENSIVE METABOLIC PANEL - Abnormal; Notable for the following components:   Glucose, Bld 119 (*)    All other components within normal limits  SURGICAL PCR SCREEN  APTT  PROTIME-INR  TYPE AND SCREEN  ABO/RH      IMAGES:   EKG: 03/05/18 Rate 92 bpm Probably left ventricular hypertrophy  Borderline prolonged QT interval  CV: Echo 03/05/18 Study Conclusions  - Left ventricle: Wall thickness was increased in a pattern of   moderate LVH. Systolic function was mildly reduced. The estimated   ejection fraction was in the range of 45% to 50%. Doppler   parameters are consistent with abnormal left ventricular   relaxation (grade 1 diastolic dysfunction). Past Medical History:  Diagnosis Date  . Hypertension   . Stroke [facility])     Past Surgical History:  Procedure Laterality Date  . BACK SURGERY  2009  . NO PAST SURGERIES      MEDICATIONS: . amLODipine (NORVASC) 10 MG tablet  . aspirin EC 81 MG EC tablet  . atorvastatin (LIPITOR) 10 MG tablet  . HYDROcodone-acetaminophen (NORCO) 10-325 MG tablet  . lisinopril-hydrochlorothiazide (ZESTORETIC) 10-12.5 MG tablet   No current facility-administered medications for this encounter.    Maia Plan [facility] Pre-Surgical Testing [phone] 11/18/18  10:38 AM

## 2018-11-17 ENCOUNTER — Other Ambulatory Visit (HOSPITAL_COMMUNITY)
Admission: RE | Admit: 2018-11-17 | Discharge: 2018-11-17 | Disposition: A | Discharge status: Home / Self Care | Payer: BC Managed Care – PPO | Source: Ambulatory Visit | Attending: Orthopedic Surgery | Admitting: Orthopedic Surgery

## 2018-11-17 DIAGNOSIS — Z01812 Encounter for preprocedural laboratory examination: Secondary | ICD-10-CM | POA: Diagnosis not present

## 2018-11-17 DIAGNOSIS — Z1159 Encounter for screening for other viral diseases: Secondary | ICD-10-CM | POA: Insufficient documentation

## 2018-11-17 LAB — SARS CORONAVIRUS 2 (TAT 6-24 HRS): SARS Coronavirus 2: NEGATIVE

## 2018-11-18 MED ORDER — BUPIVACAINE LIPOSOME 1.3 % IJ SUSP
20.0000 mL | Freq: Once | INTRAMUSCULAR | Status: DC
  Filled 2018-11-18: qty 20

## 2018-11-18 NOTE — Anesthesia Preprocedure Evaluation (Addendum)
Anesthesia Evaluation  Patient identified by MRN, date of birth, ID band Patient awake    Reviewed: Allergy & Precautions, NPO status , Patient's Chart, lab work & pertinent test results  Airway Mallampati: I       Dental no notable dental hx. (+) Teeth Intact   Pulmonary Current Smoker,    Pulmonary exam normal breath sounds clear to auscultation       Cardiovascular hypertension, Pt. on medications Normal cardiovascular exam Rhythm:Regular Rate:Normal     Neuro/Psych CVA, No Residual Symptoms    GI/Hepatic negative GI ROS, (+)     substance abuse  alcohol use,   Endo/Other    Renal/GU negative Renal ROS     Musculoskeletal   Abdominal Normal abdominal exam  (+)   Peds  Hematology negative hematology ROS (+)   Anesthesia Other Findings   Reproductive/Obstetrics                            Anesthesia Physical Anesthesia Plan  ASA: II  Anesthesia Plan: General   Post-op Pain Management:    Induction: Intravenous  PONV Risk Score and Plan: Ondansetron and Dexamethasone  Airway Management Planned: Oral ETT  Additional Equipment:   Intra-op Plan:   Post-operative Plan: Extubation in OR  Informed Consent: I have reviewed the patients History and Physical, chart, labs and discussed the procedure including the risks, benefits and alternatives for the proposed anesthesia with the patient or authorized representative who has indicated his/her understanding and acceptance.     Dental advisory given  Plan Discussed with: CRNA  Anesthesia Plan Comments: (See PAT note 11/12/2018, Konrad Felix, PA-C)       Anesthesia Quick Evaluation

## 2018-11-18 NOTE — H&P (Addendum)
Mason Decker is an 48 y.o. male.   Chief Complaint: low back pain with right leg pain and weakness HPI: Mason Decker presented with the chief complaint of low back pain with radicular right leg pain. He was picking up a hay bale the last week of December and developed some low back pain. He states got a little better. 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 He has right radicular leg pain which is now constant. Unable to get comfortable sitting , standing, or laying down. He denies change in bladder and bowel function. No groin pain. No left leg symptoms. He reports numbness and tingling in the right leg as well. He has tried some Tylenol and NSAIDs with no improvement. He has a history of lumbar laminectomy and microdiscectomy L5-S1 in 2009. He reports that he has done well until December. Did not improve with oral and injectable corticosteroids. MRI showed a large ruptured disk on the right at L5-S1 which is recurrent. Decision was made to move forward with surgery given his pain level and development of foot drop on the right side.     Past Medical History:  Diagnosis Date  . Hypertension   . Stroke [facility])     Past Surgical History:  Procedure Laterality Date  . BACK SURGERY  2009  . NO PAST SURGERIES      Family History  Problem Relation Age of Onset  . Hypertension Mother   . Hypertension Father    Social History:  reports that he has been smoking cigarettes. He has a 3.75 pack-year smoking history. He has never used smokeless tobacco. He reports current alcohol use of about 168.0 standard drinks of alcohol per week. No history on file for drug.  Allergies: No Known Allergies   Current Outpatient Medications:  .  amLODipine (NORVASC) 10 MG tablet, Take 1 tablet (10 mg total) by mouth daily., Disp: 30 tablet, Rfl: 2 .  aspirin EC 81 MG EC tablet, Take 1 tablet (81 mg total) by mouth daily., Disp: , Rfl:  .  atorvastatin (LIPITOR) 10 MG  tablet, Take 1 tablet (10 mg total) by mouth daily at 6 PM., Disp: 30 tablet, Rfl: 2 .  HYDROcodone-acetaminophen (NORCO) 10-325 MG tablet, Take 1 tablet by mouth 5 (five) times daily as needed for moderate pain., Disp: , Rfl:  .  lisinopril-hydrochlorothiazide (ZESTORETIC) 10-12.5 MG tablet, Take 1 tablet by mouth daily., Disp: , Rfl:     Results for orders placed or performed during the hospital encounter of 11/17/18 (from the past 48 hour(s))  SARS Coronavirus 2 (Performed in [facility] hospital lab)     Status: None   Collection Time: 11/17/18  9:53 AM   Specimen: Nasal Swab  Result Value Ref Range   SARS Coronavirus 2 NEGATIVE NEGATIVE    Comment: (NOTE) SARS-CoV-2 target nucleic acids are NOT DETECTED. The SARS-CoV-2 RNA is generally detectable in upper and lower respiratory specimens during the acute phase of infection. Negative results do not preclude SARS-CoV-2 infection, do not rule out co-infections with other pathogens, and should not be used as the sole basis for treatment or other patient management decisions. Negative results must be combined with clinical observations, patient history, and epidemiological information. The expected result is Negative. Fact Sheet for Patients: SugarRoll.be Fact Sheet for Healthcare Providers: https://www.woods-mathews.com/ This test is not yet approved or cleared by the [place] FDA and  has been authorized for detection and/or diagnosis  of SARS-CoV-2 by FDA under an Emergency Use Authorization (EUA). This EUA will remain  in effect (meaning this test can be used) for the duration of the COVID-19 declaration under Section 56 4(b)(1) of the Act, 21 U.S.C. section 360bbb-3(b)(1), unless the authorization is terminated or revoked sooner. Performed at [facility], [address] [place], [place] [address]     Review of Systems  Constitutional: Negative.   HENT: Negative.   Eyes:  Negative.   Respiratory: Negative.   Cardiovascular: Negative.   Gastrointestinal: Negative.   Genitourinary: Negative.   Musculoskeletal: Positive for back pain, joint pain and myalgias. Negative for falls and neck pain.  Skin: Negative.   Neurological: Positive for tingling, sensory change and weakness. Negative for dizziness, tremors, speech change, seizures, loss of consciousness and headaches.  Endo/Heme/Allergies: Negative.   Psychiatric/Behavioral: Negative.      Vitals Temp (F)  98       Pulse Rate  83       Resp  18       BP  154/92       SpO2 (%)  100       Weight (kg)  85.4kg           Physical Exam  Constitutional: He is oriented to person, place, and time. He appears well-developed and well-nourished. No distress.  HENT:  Head: Normocephalic and atraumatic.  Right Ear: External ear normal.  Left Ear: External ear normal.  Nose: Nose normal.  Mouth/Throat: Oropharynx is clear and moist.  Eyes: Conjunctivae and EOM are normal.  Neck: Normal range of motion. Neck supple.  Cardiovascular: Normal rate, regular rhythm, normal heart sounds and intact distal pulses.  No murmur heard. Respiratory: Effort normal and breath sounds normal. No respiratory distress.  GI: Soft. Bowel sounds are normal. He exhibits no distension.  Musculoskeletal:     Right hip: Normal.     Left hip: Normal.     Right knee: Normal.     Left knee: Normal.     Comments: He has severe pain with motion of the lumbar spine, which causes radiation of pain into the right LE. No tenderness over the lumbar spine. Well healed incision over the midline of the lumbar spine.   Neurological: He is alert and oriented to person, place, and time. A sensory deficit is present.  Decreased sensation along the L5 nerve root in right LE. Intact in left LE. Strength 5/5 in left LE. Dorsiflexors 3/5 on the right. Positive SLR on the right. Negative on the left.  Skin: No rash noted. He is not diaphoretic. No  erythema.  Psychiatric: He has a normal mood and affect. His behavior is normal.      Assessment/Plan Lumbar disc herniation L5-S1, right He needs a central decompression L5-S1 with microdiscectomy on the right. We did receive clearance from his PCP for surgery due to his history of stroke in 2019. Risks and benefits of the procedure were discussed with the patient. Plan for overnight stay for observation and DC home.    Anacleto Batterman Elissa LovettL Micaela Stith, PA-C 11/18/2018, 7:32 AM

## 2018-11-19 ENCOUNTER — Encounter (HOSPITAL_COMMUNITY)
Admission: RE | Disposition: A | Discharge status: Home / Self Care | Payer: Self-pay | Source: Home / Self Care | Attending: Orthopedic Surgery

## 2018-11-19 ENCOUNTER — Encounter (HOSPITAL_COMMUNITY): Payer: Self-pay | Admitting: Certified Registered Nurse Anesthetist

## 2018-11-19 ENCOUNTER — Observation Stay (HOSPITAL_COMMUNITY)
Admission: RE | Admit: 2018-11-19 | Discharge: 2018-11-20 | Disposition: A | Discharge status: Home / Self Care | Payer: BC Managed Care – PPO | Attending: Orthopedic Surgery | Admitting: Orthopedic Surgery

## 2018-11-19 ENCOUNTER — Other Ambulatory Visit: Payer: Self-pay

## 2018-11-19 ENCOUNTER — Ambulatory Visit (HOSPITAL_COMMUNITY): Payer: BC Managed Care – PPO | Admitting: Certified Registered Nurse Anesthetist

## 2018-11-19 ENCOUNTER — Ambulatory Visit (HOSPITAL_COMMUNITY): Payer: BC Managed Care – PPO

## 2018-11-19 ENCOUNTER — Ambulatory Visit (HOSPITAL_COMMUNITY): Payer: BC Managed Care – PPO | Admitting: Physician Assistant

## 2018-11-19 DIAGNOSIS — M4807 Spinal stenosis, lumbosacral region: Secondary | ICD-10-CM | POA: Insufficient documentation

## 2018-11-19 DIAGNOSIS — Z8249 Family history of ischemic heart disease and other diseases of the circulatory system: Secondary | ICD-10-CM | POA: Diagnosis not present

## 2018-11-19 DIAGNOSIS — M21371 Foot drop, right foot: Secondary | ICD-10-CM | POA: Diagnosis not present

## 2018-11-19 DIAGNOSIS — Z7982 Long term (current) use of aspirin: Secondary | ICD-10-CM | POA: Insufficient documentation

## 2018-11-19 DIAGNOSIS — M48062 Spinal stenosis, lumbar region with neurogenic claudication: Secondary | ICD-10-CM | POA: Diagnosis not present

## 2018-11-19 DIAGNOSIS — Z8673 Personal history of transient ischemic attack (TIA), and cerebral infarction without residual deficits: Secondary | ICD-10-CM | POA: Insufficient documentation

## 2018-11-19 DIAGNOSIS — I10 Essential (primary) hypertension: Secondary | ICD-10-CM | POA: Diagnosis not present

## 2018-11-19 DIAGNOSIS — F17210 Nicotine dependence, cigarettes, uncomplicated: Secondary | ICD-10-CM | POA: Insufficient documentation

## 2018-11-19 DIAGNOSIS — M5117 Intervertebral disc disorders with radiculopathy, lumbosacral region: Secondary | ICD-10-CM | POA: Insufficient documentation

## 2018-11-19 DIAGNOSIS — Z79899 Other long term (current) drug therapy: Secondary | ICD-10-CM | POA: Insufficient documentation

## 2018-11-19 DIAGNOSIS — Z419 Encounter for procedure for purposes other than remedying health state, unspecified: Secondary | ICD-10-CM

## 2018-11-19 HISTORY — PX: LUMBAR LAMINECTOMY/DECOMPRESSION MICRODISCECTOMY: SHX5026

## 2018-11-19 LAB — TYPE AND SCREEN
ABO/RH(D): A POS
Antibody Screen: NEGATIVE

## 2018-11-19 SURGERY — LUMBAR LAMINECTOMY/DECOMPRESSION MICRODISCECTOMY 1 LEVEL
Anesthesia: General | Site: Back

## 2018-11-19 MED ORDER — FENTANYL CITRATE (PF) 100 MCG/2ML IJ SOLN
INTRAMUSCULAR | Status: DC | PRN
  Administered 2018-11-19: 150 ug via INTRAVENOUS
  Administered 2018-11-19 (×6): 50 ug via INTRAVENOUS

## 2018-11-19 MED ORDER — DEXAMETHASONE SODIUM PHOSPHATE 10 MG/ML IJ SOLN
INTRAMUSCULAR | Status: DC | PRN
  Administered 2018-11-19: 5 mg via INTRAVENOUS

## 2018-11-19 MED ORDER — AMLODIPINE BESYLATE 10 MG PO TABS
10.0000 mg | ORAL_TABLET | Freq: Every day | ORAL | Status: DC
  Administered 2018-11-20: 10 mg via ORAL
  Filled 2018-11-19: qty 1

## 2018-11-19 MED ORDER — HYDROCHLOROTHIAZIDE 12.5 MG PO CAPS
12.5000 mg | ORAL_CAPSULE | Freq: Every day | ORAL | Status: DC
  Administered 2018-11-20: 12.5 mg via ORAL
  Filled 2018-11-19: qty 1

## 2018-11-19 MED ORDER — LIDOCAINE 2% (20 MG/ML) 5 ML SYRINGE
INTRAMUSCULAR | Status: DC | PRN
  Administered 2018-11-19: 100 mg via INTRAVENOUS

## 2018-11-19 MED ORDER — BACITRACIN-NEOMYCIN-POLYMYXIN 400-5-5000 EX OINT
TOPICAL_OINTMENT | CUTANEOUS | Status: AC
  Filled 2018-11-19: qty 1

## 2018-11-19 MED ORDER — FENTANYL CITRATE (PF) 100 MCG/2ML IJ SOLN
INTRAMUSCULAR | Status: AC
  Filled 2018-11-19: qty 2

## 2018-11-19 MED ORDER — ONDANSETRON HCL 4 MG PO TABS: 4.0000 mg | ORAL_TABLET | Freq: Four times a day (QID) | ORAL | Status: DC | PRN

## 2018-11-19 MED ORDER — MENTHOL 3 MG MT LOZG: 1.0000 | LOZENGE | OROMUCOSAL | Status: DC | PRN

## 2018-11-19 MED ORDER — ROCURONIUM BROMIDE 10 MG/ML (PF) SYRINGE
PREFILLED_SYRINGE | INTRAVENOUS | Status: DC | PRN
  Administered 2018-11-19: 50 mg via INTRAVENOUS
  Administered 2018-11-19: 10 mg via INTRAVENOUS
  Administered 2018-11-19: 20 mg via INTRAVENOUS
  Administered 2018-11-19: 10 mg via INTRAVENOUS

## 2018-11-19 MED ORDER — MIDAZOLAM HCL 2 MG/2ML IJ SOLN
INTRAMUSCULAR | Status: AC
  Filled 2018-11-19: qty 2

## 2018-11-19 MED ORDER — THROMBIN (RECOMBINANT) 5000 UNITS EX SOLR
CUTANEOUS | Status: AC
  Filled 2018-11-19: qty 10000

## 2018-11-19 MED ORDER — SODIUM CHLORIDE 0.9 % IV SOLN
INTRAVENOUS | Status: DC | PRN
  Administered 2018-11-19: 10:00:00 500 mL

## 2018-11-19 MED ORDER — HYDROCODONE-ACETAMINOPHEN 5-325 MG PO TABS
1.0000 | ORAL_TABLET | ORAL | Status: DC | PRN
  Administered 2018-11-19: 1 via ORAL
  Filled 2018-11-19: qty 1

## 2018-11-19 MED ORDER — ONDANSETRON HCL 4 MG/2ML IJ SOLN
INTRAMUSCULAR | Status: AC
  Filled 2018-11-19: qty 2

## 2018-11-19 MED ORDER — MEPERIDINE HCL 50 MG/ML IJ SOLN: 6.2500 mg | INTRAMUSCULAR | Status: DC | PRN

## 2018-11-19 MED ORDER — ACETAMINOPHEN 325 MG PO TABS: 650.0000 mg | ORAL_TABLET | ORAL | Status: DC | PRN

## 2018-11-19 MED ORDER — METHOCARBAMOL 500 MG PO TABS
500.0000 mg | ORAL_TABLET | Freq: Four times a day (QID) | ORAL | Status: DC | PRN
  Administered 2018-11-20: 500 mg via ORAL
  Filled 2018-11-19: qty 1

## 2018-11-19 MED ORDER — HYDROMORPHONE HCL 1 MG/ML IJ SOLN
0.2500 mg | INTRAMUSCULAR | Status: DC | PRN
  Administered 2018-11-19 (×4): 0.5 mg via INTRAVENOUS

## 2018-11-19 MED ORDER — PHENYLEPHRINE 40 MCG/ML (10ML) SYRINGE FOR IV PUSH (FOR BLOOD PRESSURE SUPPORT)
PREFILLED_SYRINGE | INTRAVENOUS | Status: DC | PRN
  Administered 2018-11-19: 120 ug via INTRAVENOUS
  Administered 2018-11-19 (×5): 80 ug via INTRAVENOUS

## 2018-11-19 MED ORDER — FENTANYL CITRATE (PF) 250 MCG/5ML IJ SOLN
INTRAMUSCULAR | Status: AC
  Filled 2018-11-19: qty 5

## 2018-11-19 MED ORDER — SODIUM CHLORIDE (PF) 0.9 % IJ SOLN
INTRAMUSCULAR | Status: AC
  Filled 2018-11-19: qty 20

## 2018-11-19 MED ORDER — LISINOPRIL 10 MG PO TABS
10.0000 mg | ORAL_TABLET | Freq: Every day | ORAL | Status: DC
  Administered 2018-11-20: 10 mg via ORAL
  Filled 2018-11-19: qty 1

## 2018-11-19 MED ORDER — METHOCARBAMOL 500 MG IVPB - SIMPLE MED
500.0000 mg | Freq: Four times a day (QID) | INTRAVENOUS | Status: DC | PRN
  Administered 2018-11-19: 500 mg via INTRAVENOUS
  Filled 2018-11-19: qty 50

## 2018-11-19 MED ORDER — PROPOFOL 10 MG/ML IV BOLUS
INTRAVENOUS | Status: DC | PRN
  Administered 2018-11-19: 200 mg via INTRAVENOUS

## 2018-11-19 MED ORDER — SUCCINYLCHOLINE CHLORIDE 200 MG/10ML IV SOSY
PREFILLED_SYRINGE | INTRAVENOUS | Status: AC
  Filled 2018-11-19: qty 10

## 2018-11-19 MED ORDER — ATORVASTATIN CALCIUM 10 MG PO TABS: 10.0000 mg | ORAL_TABLET | Freq: Every day | ORAL | Status: DC

## 2018-11-19 MED ORDER — CEFAZOLIN SODIUM-DEXTROSE 1-4 GM/50ML-% IV SOLN
1.0000 g | Freq: Three times a day (TID) | INTRAVENOUS | Status: AC
  Administered 2018-11-19 – 2018-11-20 (×3): 1 g via INTRAVENOUS
  Filled 2018-11-19 (×4): qty 50

## 2018-11-19 MED ORDER — DEXAMETHASONE SODIUM PHOSPHATE 10 MG/ML IJ SOLN
INTRAMUSCULAR | Status: AC
  Filled 2018-11-19: qty 1

## 2018-11-19 MED ORDER — BISACODYL 5 MG PO TBEC: 5.0000 mg | DELAYED_RELEASE_TABLET | Freq: Every day | ORAL | Status: DC | PRN

## 2018-11-19 MED ORDER — SUCCINYLCHOLINE CHLORIDE 20 MG/ML IJ SOLN
INTRAMUSCULAR | Status: DC | PRN
  Administered 2018-11-19: 120 mg via INTRAVENOUS

## 2018-11-19 MED ORDER — SODIUM CHLORIDE 0.9 % IV SOLN
INTRAVENOUS | Status: AC
  Filled 2018-11-19: qty 500000

## 2018-11-19 MED ORDER — CHLORHEXIDINE GLUCONATE 4 % EX LIQD: 60.0000 mL | Freq: Once | CUTANEOUS | Status: DC

## 2018-11-19 MED ORDER — HEMOSTATIC AGENTS (NO CHARGE) OPTIME
TOPICAL | Status: DC | PRN
  Administered 2018-11-19: 1 via TOPICAL

## 2018-11-19 MED ORDER — THROMBIN 5000 UNITS EX SOLR
CUTANEOUS | Status: DC | PRN
  Administered 2018-11-19: 10000 [IU] via TOPICAL

## 2018-11-19 MED ORDER — PROPOFOL 10 MG/ML IV BOLUS
INTRAVENOUS | Status: AC
  Filled 2018-11-19: qty 20

## 2018-11-19 MED ORDER — BUPIVACAINE-EPINEPHRINE 0.5% -1:200000 IJ SOLN
INTRAMUSCULAR | Status: AC
  Filled 2018-11-19: qty 1

## 2018-11-19 MED ORDER — PROMETHAZINE HCL 25 MG/ML IJ SOLN: 6.2500 mg | INTRAMUSCULAR | Status: DC | PRN

## 2018-11-19 MED ORDER — HYDROMORPHONE HCL 1 MG/ML IJ SOLN: 0.5000 mg | INTRAMUSCULAR | Status: DC | PRN

## 2018-11-19 MED ORDER — LACTATED RINGERS IV SOLN
INTRAVENOUS | Status: DC
  Administered 2018-11-19: 12:00:00 via INTRAVENOUS

## 2018-11-19 MED ORDER — FLEET ENEMA 7-19 GM/118ML RE ENEM: 1.0000 | ENEMA | Freq: Once | RECTAL | Status: DC | PRN

## 2018-11-19 MED ORDER — KETOROLAC TROMETHAMINE 30 MG/ML IJ SOLN
INTRAMUSCULAR | Status: AC
  Filled 2018-11-19: qty 1

## 2018-11-19 MED ORDER — PHENOL 1.4 % MT LIQD: 1.0000 | OROMUCOSAL | Status: DC | PRN

## 2018-11-19 MED ORDER — POLYETHYLENE GLYCOL 3350 17 G PO PACK: 17.0000 g | PACK | Freq: Every day | ORAL | Status: DC | PRN

## 2018-11-19 MED ORDER — BUPIVACAINE LIPOSOME 1.3 % IJ SUSP
INTRAMUSCULAR | Status: DC | PRN
  Administered 2018-11-19: 20 mL

## 2018-11-19 MED ORDER — LACTATED RINGERS IV SOLN
INTRAVENOUS | Status: DC
  Administered 2018-11-19 (×3): via INTRAVENOUS

## 2018-11-19 MED ORDER — CEFAZOLIN SODIUM-DEXTROSE 2-4 GM/100ML-% IV SOLN
2.0000 g | INTRAVENOUS | Status: AC
  Administered 2018-11-19: 2 g via INTRAVENOUS
  Filled 2018-11-19: qty 100

## 2018-11-19 MED ORDER — BUPIVACAINE-EPINEPHRINE 0.5% -1:200000 IJ SOLN
INTRAMUSCULAR | Status: DC | PRN
  Administered 2018-11-19: 20 mL

## 2018-11-19 MED ORDER — ACETAMINOPHEN 650 MG RE SUPP: 650.0000 mg | RECTAL | Status: DC | PRN

## 2018-11-19 MED ORDER — LISINOPRIL-HYDROCHLOROTHIAZIDE 10-12.5 MG PO TABS: 1.0000 | ORAL_TABLET | Freq: Every day | ORAL | Status: DC

## 2018-11-19 MED ORDER — HYDROCODONE-ACETAMINOPHEN 10-325 MG PO TABS
2.0000 | ORAL_TABLET | ORAL | Status: DC | PRN
  Administered 2018-11-19 – 2018-11-20 (×4): 2 via ORAL
  Filled 2018-11-19 (×5): qty 2

## 2018-11-19 MED ORDER — ONDANSETRON HCL 4 MG/2ML IJ SOLN
INTRAMUSCULAR | Status: DC | PRN
  Administered 2018-11-19: 4 mg via INTRAVENOUS

## 2018-11-19 MED ORDER — EPHEDRINE SULFATE-NACL 50-0.9 MG/10ML-% IV SOSY
PREFILLED_SYRINGE | INTRAVENOUS | Status: DC | PRN
  Administered 2018-11-19 (×3): 10 mg via INTRAVENOUS

## 2018-11-19 MED ORDER — KETOROLAC TROMETHAMINE 30 MG/ML IJ SOLN
30.0000 mg | Freq: Once | INTRAMUSCULAR | Status: AC | PRN
  Administered 2018-11-19: 30 mg via INTRAVENOUS

## 2018-11-19 MED ORDER — LIDOCAINE 2% (20 MG/ML) 5 ML SYRINGE
INTRAMUSCULAR | Status: AC
  Filled 2018-11-19: qty 5

## 2018-11-19 MED ORDER — SUGAMMADEX SODIUM 200 MG/2ML IV SOLN
INTRAVENOUS | Status: DC | PRN
  Administered 2018-11-19: 200 mg via INTRAVENOUS

## 2018-11-19 MED ORDER — ONDANSETRON HCL 4 MG/2ML IJ SOLN: 4.0000 mg | Freq: Four times a day (QID) | INTRAMUSCULAR | Status: DC | PRN

## 2018-11-19 MED ORDER — METHOCARBAMOL 500 MG IVPB - SIMPLE MED
INTRAVENOUS | Status: AC
  Filled 2018-11-19: qty 50

## 2018-11-19 MED ORDER — HYDROMORPHONE HCL 1 MG/ML IJ SOLN
INTRAMUSCULAR | Status: AC
  Filled 2018-11-19: qty 2

## 2018-11-19 MED ORDER — MIDAZOLAM HCL 5 MG/5ML IJ SOLN
INTRAMUSCULAR | Status: DC | PRN
  Administered 2018-11-19: 2 mg via INTRAVENOUS

## 2018-11-19 SURGICAL SUPPLY — 44 items
AGENT HMST SPONGE THK3/8 (HEMOSTASIS) ×1
BAG DECANTER FOR FLEXI CONT (MISCELLANEOUS) ×3 IMPLANT
BAG SPEC THK2 15X12 ZIP CLS (MISCELLANEOUS)
BAG ZIPLOCK 12X15 (MISCELLANEOUS) IMPLANT
CLEANER TIP ELECTROSURG 2X2 (MISCELLANEOUS) ×3 IMPLANT
CLOSURE WOUND 1/2 X4 (GAUZE/BANDAGES/DRESSINGS)
COVER SURGICAL LIGHT HANDLE (MISCELLANEOUS) ×3 IMPLANT
COVER WAND RF STERILE (DRAPES) IMPLANT
DRAPE MICROSCOPE LEICA (MISCELLANEOUS) ×3 IMPLANT
DRAPE POUCH INSTRU U-SHP 10X18 (DRAPES) ×3 IMPLANT
DRAPE SHEET LG 3/4 BI-LAMINATE (DRAPES) ×3 IMPLANT
DRAPE SURG 17X11 SM STRL (DRAPES) ×3 IMPLANT
DRSG EMULSION OIL 3X3 NADH (GAUZE/BANDAGES/DRESSINGS) ×2 IMPLANT
DRSG PAD ABDOMINAL 8X10 ST (GAUZE/BANDAGES/DRESSINGS) ×4 IMPLANT
DURAPREP 26ML APPLICATOR (WOUND CARE) ×3 IMPLANT
ELECT BLADE TIP CTD 4 INCH (ELECTRODE) ×3 IMPLANT
ELECT REM PT RETURN 15FT ADLT (MISCELLANEOUS) ×3 IMPLANT
GAUZE SPONGE 4X4 12PLY STRL (GAUZE/BANDAGES/DRESSINGS) ×3 IMPLANT
GLOVE BIOGEL PI IND STRL 8.5 (GLOVE) ×1 IMPLANT
GLOVE BIOGEL PI INDICATOR 8.5 (GLOVE) ×2
GLOVE ECLIPSE 8.0 STRL XLNG CF (GLOVE) ×3 IMPLANT
GOWN STRL REUS W/TWL XL LVL3 (GOWN DISPOSABLE) ×3 IMPLANT
HEMOSTAT SPONGE AVITENE ULTRA (HEMOSTASIS) ×3 IMPLANT
KIT BASIN OR (CUSTOM PROCEDURE TRAY) ×3 IMPLANT
KIT TURNOVER KIT A (KITS) IMPLANT
MANIFOLD NEPTUNE II (INSTRUMENTS) ×3 IMPLANT
NDL SPNL 18GX3.5 QUINCKE PK (NEEDLE) ×2 IMPLANT
NEEDLE HYPO 22GX1.5 SAFETY (NEEDLE) ×3 IMPLANT
NEEDLE SPNL 18GX3.5 QUINCKE PK (NEEDLE) ×6 IMPLANT
PACK LAMINECTOMY ORTHO (CUSTOM PROCEDURE TRAY) ×3 IMPLANT
PATTIES SURGICAL .5 X.5 (GAUZE/BANDAGES/DRESSINGS) IMPLANT
PATTIES SURGICAL .75X.75 (GAUZE/BANDAGES/DRESSINGS) ×3 IMPLANT
PATTIES SURGICAL 1X1 (DISPOSABLE) ×3 IMPLANT
SPONGE LAP 4X18 RFD (DISPOSABLE) IMPLANT
STAPLER VISISTAT 35W (STAPLE) ×3 IMPLANT
STRIP CLOSURE SKIN 1/2X4 (GAUZE/BANDAGES/DRESSINGS) ×1 IMPLANT
SUT VIC AB 1 CT1 27 (SUTURE) ×6
SUT VIC AB 1 CT1 27XBRD ANTBC (SUTURE) ×2 IMPLANT
SUT VIC AB 2-0 CT1 27 (SUTURE) ×6
SUT VIC AB 2-0 CT1 TAPERPNT 27 (SUTURE) ×2 IMPLANT
SYR 20CC LL (SYRINGE) ×6 IMPLANT
TOWEL OR 17X26 10 PK STRL BLUE (TOWEL DISPOSABLE) ×3 IMPLANT
TOWEL OR NON WOVEN STRL DISP B (DISPOSABLE) ×3 IMPLANT
TRAY FOLEY MTR SLVR 16FR STAT (SET/KITS/TRAYS/PACK) ×2 IMPLANT

## 2018-11-19 NOTE — Discharge Instructions (Addendum)
For the first three days, remove your dressing, and tape a piece of saran wrap over your incision. Take your shower, then remove the saran wrap and put a clean dressing on. After three days you can shower without the saran wrap.  No lifting or excessive bending. Call Dr. Gladstone Lighter if any wound complications or temperature of 101 degrees F or over.  Call the office for an appointment to see Dr. Gladstone Lighter in two weeks: [phone] and ask for Dr. Charlestine Night nurse, Brunilda Payor.

## 2018-11-19 NOTE — Anesthesia Procedure Notes (Signed)
Procedure Name: Intubation Performed by: Gean Maidens, CRNA Pre-anesthesia Checklist: Patient identified, Emergency Drugs available, Suction available and Timeout performed Patient Re-evaluated:Patient Re-evaluated prior to induction Oxygen Delivery Method: Circle system utilized Preoxygenation: Pre-oxygenation with 100% oxygen Induction Type: IV induction Ventilation: Mask ventilation without difficulty Laryngoscope Size: Mac and 4 Grade View: Grade I Tube type: Oral Tube size: 7.5 mm Number of attempts: 1 Airway Equipment and Method: Stylet Placement Confirmation: ETT inserted through vocal cords under direct vision,  positive ETCO2 and breath sounds checked- equal and bilateral Secured at: 23 cm Tube secured with: Tape Dental Injury: Teeth and Oropharynx as per pre-operative assessment

## 2018-11-19 NOTE — Transfer of Care (Signed)
Immediate Anesthesia Transfer of Care Note  Patient: Mason Decker  Procedure(s) Performed: Central decompression L5-S1 with foraminotomy L5-S1 root on the right (N/A Back)  Patient Location: PACU  Anesthesia Type:General  Level of Consciousness: awake, alert  and oriented  Airway & Oxygen Therapy: Patient Spontanous Breathing and Patient connected to face mask oxygen  Post-op Assessment: Report given to RN and Post -op Vital signs reviewed and stable  Post vital signs: Reviewed and stable  Last Vitals:  Vitals Value Taken Time  BP 140/90 11/19/18 1035  Temp    Pulse 99 11/19/18 1036  Resp 11 11/19/18 1036  SpO2 100 % 11/19/18 1036  Vitals shown include unvalidated device data.  Last Pain:  Vitals:   11/19/18 0714  TempSrc:   PainSc: 0-No pain         Complications: No apparent anesthesia complications

## 2018-11-19 NOTE — Brief Op Note (Signed)
11/19/2018  10:19 AM  PATIENT:  Mason Decker  48 y.o. 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  PROCEDURE:  Complete Decompressive Lumbar Laminectomy at L-5-S-1 for Spinal Stenosis and Foraminotomies for the L-5 and S-1 nerve roots on the Right.Exploration of the L-5-S-1 disc Space on the right  SURGEON:  Surgeon(s) and Role:    [name], MD - Primary  PHYSICIAN ASSISTANT: Ardeen Jourdain PA  ASSISTANTS: Ardeen Jourdain PA  ANESTHESIA:   general  EBL:  25 mL   BLOOD ADMINISTERED:none  DRAINS: none   LOCAL MEDICATIONS USED:  MARCAINE 20cc of 0.5% with Epinephrine at the start of the case and 20cc of Exparel at the end of the case.   SPECIMEN:  No Specimen  DISPOSITION OF SPECIMEN:  N/A  COUNTS:  Correct  TOURNIQUET:  * No tourniquets in log *  DICTATION: .Other Dictation: Dictation Number  [id number]  PLAN OF CARE: Admit for overnight observation  PATIENT DISPOSITION:  PACU - hemodynamically stable.   Delay start of Pharmacological VTE agent (>24hrs) due to surgical blood loss or risk of bleeding: yes

## 2018-11-19 NOTE — Interval H&P Note (Signed)
History and Physical Interval Note:  11/19/2018 8:08 AM  Mason Decker  has presented today for surgery, with the diagnosis of L5-S1 herniated nucleus polposus, parial foot drop.  The various methods of treatment have been discussed with the patient and family. After consideration of risks, benefits and other options for treatment, the patient has consented to  Procedure(s) with comments: Central decompression L5-S1 with microdiscectomy on the right (N/A) - 143min as a surgical intervention.  The patient's history has been reviewed, patient examined, no change in status, stable for surgery.  I have reviewed the patient's chart and labs.  Questions were answered to the patient's satisfaction.     [name]

## 2018-11-19 NOTE — Evaluation (Signed)
Physical Therapy Evaluation Patient Details Name: Mason Decker MRN: [id number] DOB: [birth year] Today's Date: 11/19/2018   History of Present Illness  Central decompressive lumbar laminectomy at L5-S1. Hx: R posterior limb internal capsule CVA, tobacco use  Clinical Impression  Patient evaluated by Physical Therapy with no further acute PT needs identified. All education has been completed and the patient has no further questions.  Pt supervision to mod I for mobility. Reviewed precautions, pt does not feel he needs PT to return, discussed stairs, pt verbalizes technique.  See below for any follow-up Physical Therapy or equipment needs. PT is signing off. Thank you for this referral.     Follow Up Recommendations No PT follow up;Follow surgeon's recommendation for DC plan and follow-up therapies    Equipment Recommendations  None recommended by PT    Recommendations for Other Services       Precautions / Restrictions Precautions Precautions: Back Precaution Booklet Issued: Yes (comment)(handout) Restrictions Weight Bearing Restrictions: No      Mobility  Bed Mobility   Bed Mobility: Rolling;Sidelying to Sit;Sit to Sidelying Rolling: Supervision Sidelying to sit: Supervision;Min guard     Sit to sidelying: Supervision General bed mobility comments: cues for log roll  Transfers Overall transfer level: Needs assistance   Transfers: Sit to/from Stand Sit to Stand: Supervision         General transfer comment: for safety  Ambulation/Gait Ambulation/Gait assistance: Supervision Gait Distance (Feet): 180 Feet Assistive device: None Gait Pattern/deviations: Step-through pattern;Decreased stride length;Wide base of support     General Gait Details: for safety only, steady gait  no LOB  Stairs            Wheelchair Mobility    Modified Rankin (Stroke Patients Only)       Balance                                              Pertinent Vitals/Pain Pain Assessment: 0-10 Pain Score: 4  Pain Location: with bed mobility Pain Descriptors / Indicators: Sore;Burning;Grimacing Pain Intervention(s): Limited activity within patient's tolerance;Monitored during session;Repositioned    Home Living Family/patient expects to be discharged to:: Private residence   Available Help at Discharge: Family;Available 24 hours/day Type of Home: House Home Access: Stairs to enter Entrance Stairs-Rails: Left Entrance Stairs-Number of Steps: 1 Home Layout: One level Home Equipment: None      Prior Function Level of Independence: Independent               Hand Dominance        Extremity/Trunk Assessment   Upper Extremity Assessment Upper Extremity Assessment: Defer to OT evaluation    Lower Extremity Assessment Lower Extremity Assessment: RLE deficits/detail RLE Deficits / Details: grossly WFL,  ankle at least 3/5. some numbness/tingling as prior to surgery       Communication   Communication: No difficulties  Cognition Arousal/Alertness: Awake/alert Behavior During Therapy: WFL for tasks assessed/performed Overall Cognitive Status: Within Functional Limits for tasks assessed                                        General Comments      Exercises     Assessment/Plan    PT Assessment Patent does not need any further PT services  PT  Problem List         PT Treatment Interventions      PT Goals (Current goals can be found in the Care Plan section)  Acute Rehab PT Goals PT Goal Formulation: With patient Time For Goal Achievement: 11/19/18 Potential to Achieve Goals: Good    Frequency     Barriers to discharge        Co-evaluation               AM-PAC PT "6 Clicks" Mobility  Outcome Measure Help needed turning from your back to your side while in a flat bed without using bedrails?: None Help needed moving from lying on your back to sitting on the side of a  flat bed without using bedrails?: A Little Help needed moving to and from a bed to a chair (including a wheelchair)?: None Help needed standing up from a chair using your arms (e.g., wheelchair or bedside chair)?: None Help needed to walk in hospital room?: None Help needed climbing 3-5 steps with a railing? : None 6 Click Score: 23    End of Session   Activity Tolerance: Patient tolerated treatment well Patient left: in bed;with call bell/phone within reach;with bed alarm set   PT Visit Diagnosis: Difficulty in walking, not elsewhere classified (R26.2)    Time: 4098-1191 PT Time Calculation (min) (ACUTE ONLY): 19 min   Charges:   PT Evaluation $PT Eval Low Complexity: 1 Low          Kenyon Ana, PT  Pager: [phone] Acute Rehab Dept [facility]): [phone]   11/19/2018   [facility] 11/19/2018, 4:08 PM

## 2018-11-19 NOTE — Op Note (Signed)
NAME: Mason Decker, Mason Decker MEDICAL RECORD AO:[id number] ACCOUNT [id number] DATE OF BIRTH:06-Oct-1970 FACILITY: [facility] LOCATION: [id number] PHYSICIAN:Donnell Wion Fransico Setters, MD  OPERATIVE REPORT  DATE OF PROCEDURE:  11/19/2018  SURGEON:  [name], MD  ASSISTANT:  Ardeen Jourdain, PA  PREOPERATIVE DIAGNOSES: 1.  Spinal stenosis at L5-S1. 2.  Severe lateral recess stenosis at L5-S1. 3.  Partial foot drop on the right.  All his symptoms were on the right. 4.  Possible recurrent herniated disk.  Note he had a prior hemilaminectomy and microdiskectomy 11 years ago and did well until this past December.  POSTOPERATIVE DIAGNOSES: 1.  Spinal stenosis L5-S1. 2.  Severe lateral recess stenosis involving the L5 nerve root and the S1 nerve root on the right.  OPERATION:   1.  Central decompressive lumbar laminectomy at L5-S1. 2.  Foraminotomy for the L5 root on the right. 3.  Foraminotomy for the S1 root on the right. 4.  Decompression of the severe stenosis secondary to scar tissue as well on the right.  DESCRIPTION OF PROCEDURE:  Under general anesthesia, the patient on the [place]cam[place] frame, a routine orthopedic prep and draping of the lower back was carried out.  The appropriate time-out was carried out.  I also marked the appropriate right side of his  back in the holding area.  He received 2 g of IV Ancef.  Two needles were placed in the back for localization purposes.  X-ray was taken to verify the position.  At this time, incision was made through the previous incision site at L5-S1.  Bleeders  identified and cauterized.  The muscle was stripped from the lamina and spinous processes bilaterally.  Another x-ray was taken with the Kocher clamp in place and an instrument in the disk space.  After this, we then continued the dissection.  We then  inserted the [facility] retractors.  Bleeding was controlled.  I then removed the spinous process of L5.  I went up proximally and distally, removed a large amount  of scar tissue with the curettes first.  Once we identified the disk space, another x-ray  was taken.  At this time, we then proceeded with our laminectomies.  I went down centrally and decompressed the L5-S1 region.  There was a large amount of scar tissue we had to remove.  We gently removed that with the microscope in place.  No violation  of the dura occurred.  I went out first and gently retracted the dura and went out and decompressed the lateral recesses.  I exposed both the L5 and the S1 nerve roots.  I was able to easily pass a hockey stick into the foramen for the L5 root and the S1  root.  I went up proximally as well beyond the disk space.  I looked and made sure there were no free disk fragments.  There were none.  The fact the nerve roots now were easily exposed and free once the scar tissue was removed, we had to utilize a  bipolar to cauterize the lateral recess veins.  We made sure the dura was free.  We checked and the L5 root and the S1 roots were now free.  I did utilize a nerve hook and the [place] 4 to make sure there was no disk material noted.  There was none.  We  thoroughly irrigated out the area and closed the wound in layers in the usual fashion, except I left a small distal deep and proximal part of the wound open for  drainage purposes.  At the beginning of the case, I injected 20 mL 0.5% Marcaine with  epinephrine into the soft tissue to control the bleeding.  Also at this time at the end of the case, I injected 20 mL of Exparel.  The patient will be kept overnight for observation, and I note that he did have a partial foot drop on that right.  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  LN/NUANCE  D:11/19/2018 T:11/19/2018 JOB:[id number]

## 2018-11-20 ENCOUNTER — Encounter (HOSPITAL_COMMUNITY): Payer: Self-pay | Admitting: Orthopedic Surgery

## 2018-11-20 DIAGNOSIS — M48062 Spinal stenosis, lumbar region with neurogenic claudication: Secondary | ICD-10-CM | POA: Diagnosis not present

## 2018-11-20 MED ORDER — HYDROCODONE-ACETAMINOPHEN 10-325 MG PO TABS: 1.0000 | ORAL_TABLET | Freq: Four times a day (QID) | ORAL | 0 refills | Status: DC | PRN

## 2018-11-20 MED ORDER — METHOCARBAMOL 500 MG PO TABS: 500.0000 mg | ORAL_TABLET | Freq: Four times a day (QID) | ORAL | 0 refills | Status: DC | PRN

## 2018-11-20 NOTE — Plan of Care (Signed)
  Problem: Education: Goal: Knowledge of General Education information will improve Description: Including pain rating scale, medication(s)/side effects and non-pharmacologic comfort measures Outcome: Progressing   Problem: Clinical Measurements: Goal: Respiratory complications will improve Outcome: Progressing   Problem: Nutrition: Goal: Adequate nutrition will be maintained Outcome: Progressing   Problem: Elimination: Goal: Will not experience complications related to urinary retention Outcome: Progressing   Problem: Pain Managment: Goal: General experience of comfort will improve Outcome: Progressing   Problem: Safety: Goal: Ability to remain free from injury will improve Outcome: Progressing   

## 2018-11-20 NOTE — Evaluation (Signed)
Occupational Therapy Evaluation Patient Details Name: Mason Decker MRN: [id number] DOB: 10-02-1970 Today's Date: 11/20/2018    History of Present Illness Central decompressive lumbar laminectomy at L5-S1. Hx: R posterior limb internal capsule CVA, tobacco use   Clinical Impression   OT education complete regarding ADL activity s/p back surgery.      Follow Up Recommendations  No OT follow up    Equipment Recommendations  None recommended by OT       Precautions / Restrictions Precautions Precautions: Back      Mobility Bed Mobility   Bed Mobility: Rolling;Sidelying to Sit;Sit to Sidelying Rolling: Supervision Sidelying to sit: Supervision     Sit to sidelying: Supervision General bed mobility comments: cues for log roll  Transfers Overall transfer level: Needs assistance   Transfers: Sit to/from Stand;Stand Pivot Transfers Sit to Stand: Supervision Stand pivot transfers: Supervision       General transfer comment: for safety        ADL either performed or assessed with clinical judgement   ADL Overall ADL's : Modified independent                                       General ADL Comments: Education complete regarding ADL activity s/p back surgery.  Handout provided     \             Pertinent Vitals/Pain Pain Assessment: No/denies pain        Extremity/Trunk Assessment Upper Extremity Assessment Upper Extremity Assessment: Overall WFL for tasks assessed           Communication Communication Communication: No difficulties   Cognition Arousal/Alertness: Awake/alert Behavior During Therapy: WFL for tasks assessed/performed Overall Cognitive Status: Within Functional Limits for tasks assessed                                                Home Living Family/patient expects to be discharged to:: Private residence   Available Help at Discharge: Family;Available 24 hours/day Type of Home: House Home  Access: Stairs to enter [employer] of Steps: 1 Entrance Stairs-Rails: Left Home Layout: One level               Home Equipment: None          Prior Functioning/Environment Level of Independence: Independent                          OT Goals(Current goals can be found in the care plan section) Acute Rehab OT Goals Patient Stated Goal: home today  OT Frequency:      AM-PAC OT "6 Clicks" Daily Activity     Outcome Measure Help from another person eating meals?: None Help from another person taking care of personal grooming?: None Help from another person toileting, which includes using toliet, bedpan, or urinal?: None Help from another person bathing (including washing, rinsing, drying)?: None Help from another person to put on and taking off regular upper body clothing?: None Help from another person to put on and taking off regular lower body clothing?: None 6 Click Score: 24   End of Session Nurse Communication: Mobility status  Activity Tolerance: Patient tolerated treatment well Patient left: in chair  Time: [id number] OT Time Calculation (min): 20 min Charges:  OT General Charges $OT Visit: 1 Visit OT Evaluation $OT Eval Low Complexity: 1 Low  Lise AuerLori Lacreshia Bondarenko, OT Acute Rehabilitation Services [phone] Office- [phone]     Mason Decker, Mason Decker 11/20/2018, 1:34 PM

## 2018-11-20 NOTE — Progress Notes (Signed)
Subjective: 1 Day Post-Op Procedure(s) (LRB): Central decompression L5-S1 with foraminotomy L5-S1 root on the right (N/A) Patient reports pain as 1 on 0-10 scale. Doing very well today. No further leg pain and his dorsiflexors are back to normal. Will  DC   Objective: Vital signs in last 24 hours: Temp:  [97.6 F (36.4 C)-98.5 F (36.9 C)] 97.7 F (36.5 C) (07/09 0449) Pulse Rate:  [73-101] 73 (07/09 0449) Resp:  [10-16] 14 (07/09 0449) BP: (117-148)/(64-94) 127/83 (07/09 0449) SpO2:  [97 %-100 %] 99 % (07/09 0449) Weight:  [85.4 kg] 85.4 kg (07/08 0714)  Intake/Output from previous day: 07/08 0701 - 07/09 0700 In: 3437.6 [P.O.:760; I.V.:2577.6; IV Piggyback:100] Out: 5550 [Urine:5525; Blood:25] Intake/Output this shift: No intake/output data recorded.  No results for input(s): HGB in the last 72 hours. No results for input(s): WBC, RBC, HCT, PLT in the last 72 hours. No results for input(s): NA, K, CL, CO2, BUN, CREATININE, GLUCOSE, CALCIUM in the last 72 hours. No results for input(s): LABPT, INR in the last 72 hours.  Neurologically intact Dorsiflexion/Plantar flexion intact   Assessment/Plan: 1 Day Post-Op Procedure(s) (LRB): Central decompression L5-S1 with foraminotomy L5-S1 root on the right (N/A) Up with therapy,then DC      [name] 11/20/2018, 7:13 AM

## 2018-11-20 NOTE — Plan of Care (Signed)
  Problem: Education: Goal: Knowledge of General Education information will improve Description: Including pain rating scale, medication(s)/side effects and non-pharmacologic comfort measures Outcome: Adequate for Discharge   Problem: Health Behavior/Discharge Planning: Goal: Ability to manage health-related needs will improve Outcome: Adequate for Discharge   Problem: Clinical Measurements: Goal: Ability to maintain clinical measurements within normal limits will improve Outcome: Adequate for Discharge Goal: Will remain free from infection Outcome: Adequate for Discharge Goal: Diagnostic test results will improve Outcome: Adequate for Discharge Goal: Respiratory complications will improve Outcome: Adequate for Discharge Goal: Cardiovascular complication will be avoided Outcome: Adequate for Discharge   Problem: Activity: Goal: Risk for activity intolerance will decrease Outcome: Adequate for Discharge   Problem: Nutrition: Goal: Adequate nutrition will be maintained Outcome: Adequate for Discharge   Problem: Coping: Goal: Level of anxiety will decrease Outcome: Adequate for Discharge   Problem: Elimination: Goal: Will not experience complications related to bowel motility Outcome: Adequate for Discharge Goal: Will not experience complications related to urinary retention Outcome: Adequate for Discharge   Problem: Pain Managment: Goal: General experience of comfort will improve Outcome: Adequate for Discharge   Problem: Safety: Goal: Ability to remain free from injury will improve Outcome: Adequate for Discharge   Problem: Skin Integrity: Goal: Risk for impaired skin integrity will decrease Outcome: Adequate for Discharge  Home with family. Discharge teaching done written information given.

## 2018-11-21 NOTE — Anesthesia Postprocedure Evaluation (Signed)
Anesthesia Post Note  Patient: Mason Decker  Procedure(s) Performed: Central decompression L5-S1 with foraminotomy L5-S1 root on the right (N/A Back)     Patient location during evaluation: PACU Anesthesia Type: General Level of consciousness: awake Pain management: pain level controlled Vital Signs Assessment: post-procedure vital signs reviewed and stable Respiratory status: spontaneous breathing Cardiovascular status: stable Postop Assessment: no apparent nausea or vomiting Anesthetic complications: no    Last Vitals:  Vitals:   11/20/18 0126 11/20/18 0449  BP: 122/77 127/83  Pulse: 75 73  Resp: 14 14  Temp: 36.6 C 36.5 C  SpO2: 98% 99%    Last Pain:  Vitals:   11/20/18 1040  TempSrc:   PainSc: 7    Pain Goal: Patients Stated Pain Goal: 3 (11/20/18 1040)                 Huston Foley

## 2018-11-21 NOTE — Discharge Summary (Signed)
Physician Discharge Summary   Patient ID: Mason Decker W Maisano MRN: [id number] DOB/AGE: 1970-11-01 48 y.o.  Admit date: 11/19/2018 Discharge date: 11/21/2018  Primary Diagnosis:   L5-S1 herniated nucleus pulposus, partial foot drop  Admission Diagnoses:  Past Medical History:  Diagnosis Date  . Hypertension   . Stroke [facility](HCC)    Discharge Diagnoses:   Active Problems:   Spinal stenosis, lumbar region with neurogenic claudication  Procedure:  Procedure(s) (LRB): Central decompression L5-S1 with foraminotomy L5-S1 root on the right (N/A)   Consults: None  HPI: Mason Decker presented with the chief complaint of low back pain with radicular right leg pain. He was picking up a hay bale the last week of December and developed some low back pain. He states got a little better. 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 He has right radicular leg pain which is now constant. Unable to get comfortable sitting , standing, or laying down. He denies change in bladder and bowel function. No groin pain. No left leg symptoms. He reports numbness and tingling in the right leg as well. He has tried some Tylenol and NSAIDs with no improvement. He has a history of lumbar laminectomy and microdiscectomy L5-S1 in 2009. He reports that he has done well until December. Did not improve with oral and injectable corticosteroids. MRI showed a large ruptured disk on the right at L5-S1 which is recurrent. Decision was made to move forward with surgery given his pain level and development of foot drop on the right side.      Laboratory Data: Hospital Outpatient Visit on 11/17/2018  Component Date Value Ref Range Status  . SARS Coronavirus 2 11/17/2018 NEGATIVE  NEGATIVE Final   Comment: (NOTE) SARS-CoV-2 target nucleic acids are NOT DETECTED. The SARS-CoV-2 RNA is generally detectable in upper and lower respiratory specimens during the acute phase of infection. Negative results  do not preclude SARS-CoV-2 infection, do not rule out co-infections with other pathogens, and should not be used as the sole basis for treatment or other patient management decisions. Negative results must be combined with clinical observations, patient history, and epidemiological information. The expected result is Negative. Fact Sheet for Patients: HairSlick.nohttps://www.fda.gov/media/138098/download Fact Sheet for Healthcare Providers: quierodirigir.comhttps://www.fda.gov/media/138095/download This test is not yet approved or cleared by the [place] FDA and  has been authorized for detection and/or diagnosis of SARS-CoV-2 by FDA under an Emergency Use Authorization (EUA). This EUA will remain  in effect (meaning this test can be used) for the duration of the COVID-19 declaration under Section 56                          4(b)(1) of the Act, 21 U.S.C. section 360bbb-3(b)(1), unless the authorization is terminated or revoked sooner. Performed at [facility], [address] [place], [place] [address]     X-Rays:Dg Lumbar Spine 2-3 Views  Result Date: 11/12/2018 CLINICAL DATA:  Preoperative study for lumbar surgery. EXAM: LUMBAR SPINE - 2-3 VIEW COMPARISON:  06/12/2007 FINDINGS: Straightening of the normal lumbar lordosis. Minimal thoracolumbar curvature convex to the left. Normal disc space heights with the exception of L5-S1 which shows mild-to-moderate narrowing. Mild lower lumbar facet osteoarthritis. IMPRESSION: Disc space narrowing L5-S1. Levels marked for operative correlation. Electronically Signed   By: Paulina FusiMark  Shogry M.D.   On: 11/12/2018 15:53   Dg Spine Portable 1 View  Result Date: 11/19/2018 CLINICAL DATA:  Lumbar laminectomy.  EXAM: PORTABLE SPINE - 1 VIEW COMPARISON:  Radiographs of November 12, 2018. FINDINGS: Single intraoperative cross-table lateral projection of the lumbar spine was obtained. This demonstrates surgical probe directed toward S1 level. IMPRESSION: Surgical localization as  described above. Electronically Signed   By: Lupita RaiderJames  Green Jr M.D.   On: 11/19/2018 10:18   Dg Spine Portable 1 View  Result Date: 11/19/2018 CLINICAL DATA:  48 year old male with intraoperative localization of the lumbar spine. EXAM: PORTABLE SPINE - 1 VIEW COMPARISON:  Earlier radiograph dated 11/19/2018 FINDINGS: There has been interval removal of upper needle. The tip of the needle is positioned over the area of L5-S1 facet. IMPRESSION: Intraoperative localization of L5-S1 facet. Electronically Signed   By: Elgie CollardArash  Radparvar M.D.   On: 11/19/2018 09:43   Dg Spine Portable 1 View  Result Date: 11/19/2018 CLINICAL DATA:  48 year old male with preoperative exam for lumbar laminectomy. Numbering of the lumbar vertebra. EXAM: PORTABLE SPINE - 1 VIEW COMPARISON:  Earlier radiograph dated 11/19/2018 and 11/12/2018 FINDINGS: Two operative devices noted. The upper device is directed at the L5 spinous process and the lower device is at the level of L5-S1 facet. IMPRESSION: Intraoperative localization lumbar spine. Electronically Signed   By: Elgie CollardArash  Radparvar M.D.   On: 11/19/2018 09:26   Dg Spine Portable 1 View  Result Date: 11/19/2018 CLINICAL DATA:  Surgery, elective.  Lumbar laminectomy. EXAM: PORTABLE SPINE - 1 VIEW COMPARISON:  Lumbar spine radiographs 11/12/2018 FINDINGS: Needles are directed at the L5 spinous process and just below the L5 spinous process. IMPRESSION: Intraoperative localization of the L5 spinous process. Electronically Signed   By: Marin Robertshristopher  Mattern M.D.   On: 11/19/2018 09:05    EKG: Orders placed or performed during the hospital encounter of 03/04/18  . ED EKG  . EKG 12-Lead  . EKG 12-Lead  . EKG     Hospital Course: Patient [facility] and taken to the OR and underwent the above state procedure without complications.  Patient tolerated the procedure well and was later transferred to the recovery room and then to the orthopaedic floor for  postoperative care.  They were given PO and IV analgesics for pain control following their surgery.  They were given 24 hours of postoperative antibiotics.   PT was consulted postop to assist with mobility and transfers.  The patient was allowed to be WBAT with therapy and was taught back precautions. Discharge planning was consulted to help with postop disposition and equipment needs.  Patient had a good night on the evening of surgery and started to get up OOB with therapy on day one. Patient was seen in rounds and was ready to go home on day one.  They were given discharge instructions and dressing directions.  They were instructed on when to follow up in the office with Dr.Gioffre.   Diet: Cardiac diet Activity:WBAT Follow-up:in 2 weeks Disposition - Home Discharged Condition: stable   Discharge Instructions    Call MD / Call 911   Complete by: As directed    If you experience chest pain or shortness of breath, CALL 911 and be transported to the hospital emergency room.  If you develope a fever above 101 F, pus (white drainage) or increased drainage or redness at the wound, or calf pain, call your surgeon's office.   Constipation Prevention   Complete by: As directed    Drink plenty of fluids.  Prune juice may be helpful.  You may use a stool softener, such  as Colace (over the counter) 100 mg twice a day.  Use MiraLax (over the counter) for constipation as needed.   Diet - low sodium heart healthy   Complete by: As directed    Discharge instructions   Complete by: As directed    For the first three days, remove your dressing, and tape a piece of saran wrap over your incision. Take your shower, then remove the saran wrap and put a clean dressing on. After three days you can shower without the saran wrap.  No lifting or excessive bending. Call Dr. Gladstone Lighter if any wound complications or temperature of 101 degrees F or over.  Call the office for an appointment to see Dr. Gladstone Lighter in two  weeks: [phone] and ask for Dr. Charlestine Night nurse, Brunilda Payor   Increase activity slowly as tolerated   Complete by: As directed      Allergies as of 11/20/2018   No Known Allergies     Medication List    TAKE these medications   amLODipine 10 MG tablet Commonly known as: NORVASC Take 1 tablet (10 mg total) by mouth daily.   aspirin 81 MG EC tablet Take 1 tablet (81 mg total) by mouth daily.   atorvastatin 10 MG tablet Commonly known as: LIPITOR Take 1 tablet (10 mg total) by mouth daily at 6 PM.   HYDROcodone-acetaminophen 10-325 MG tablet Commonly known as: NORCO Take 1 tablet by mouth every 6 (six) hours as needed for moderate pain. What changed: when to take this   lisinopril-hydrochlorothiazide 10-12.5 MG tablet Commonly known as: ZESTORETIC Take 1 tablet by mouth daily.   methocarbamol 500 MG tablet Commonly known as: ROBAXIN Take 1 tablet (500 mg total) by mouth every 6 (six) hours as needed for muscle spasms.      Follow-up Information    [name], MD. Schedule an appointment as soon as possible for a visit on 12/02/2018.   Specialty: Orthopedic Surgery Contact information: [address] [place] [address] [phone]           Signed: Ardeen Jourdain, PA-C Orthopaedic Surgery 11/21/2018, 10:59 AM

## 2019-02-05 ENCOUNTER — Encounter (HOSPITAL_COMMUNITY): Payer: Self-pay

## 2019-02-05 ENCOUNTER — Emergency Department (HOSPITAL_COMMUNITY)
Admission: EM | Admit: 2019-02-05 | Discharge: 2019-02-05 | Disposition: A | Discharge status: Home / Self Care | Payer: BC Managed Care – PPO | Attending: Emergency Medicine | Admitting: Emergency Medicine

## 2019-02-05 ENCOUNTER — Emergency Department (HOSPITAL_COMMUNITY): Payer: BC Managed Care – PPO

## 2019-02-05 ENCOUNTER — Telehealth: Payer: Self-pay | Admitting: *Deleted

## 2019-02-05 ENCOUNTER — Other Ambulatory Visit: Payer: Self-pay

## 2019-02-05 DIAGNOSIS — Z7982 Long term (current) use of aspirin: Secondary | ICD-10-CM | POA: Insufficient documentation

## 2019-02-05 DIAGNOSIS — M549 Dorsalgia, unspecified: Secondary | ICD-10-CM | POA: Diagnosis present

## 2019-02-05 DIAGNOSIS — Z8673 Personal history of transient ischemic attack (TIA), and cerebral infarction without residual deficits: Secondary | ICD-10-CM | POA: Insufficient documentation

## 2019-02-05 DIAGNOSIS — Z9889 Other specified postprocedural states: Secondary | ICD-10-CM | POA: Diagnosis not present

## 2019-02-05 DIAGNOSIS — M5441 Lumbago with sciatica, right side: Secondary | ICD-10-CM | POA: Diagnosis not present

## 2019-02-05 DIAGNOSIS — Z79899 Other long term (current) drug therapy: Secondary | ICD-10-CM | POA: Insufficient documentation

## 2019-02-05 DIAGNOSIS — M545 Low back pain, unspecified: Secondary | ICD-10-CM

## 2019-02-05 DIAGNOSIS — F17210 Nicotine dependence, cigarettes, uncomplicated: Secondary | ICD-10-CM | POA: Insufficient documentation

## 2019-02-05 DIAGNOSIS — I10 Essential (primary) hypertension: Secondary | ICD-10-CM | POA: Diagnosis not present

## 2019-02-05 MED ORDER — KETOROLAC TROMETHAMINE 30 MG/ML IJ SOLN
30.0000 mg | Freq: Once | INTRAMUSCULAR | Status: AC
  Administered 2019-02-05: 30 mg via INTRAMUSCULAR
  Filled 2019-02-05: qty 1

## 2019-02-05 MED ORDER — FENTANYL CITRATE (PF) 100 MCG/2ML IJ SOLN
50.0000 ug | Freq: Once | INTRAMUSCULAR | Status: AC
  Administered 2019-02-05: 03:00:00 50 ug via INTRAMUSCULAR
  Filled 2019-02-05: qty 2

## 2019-02-05 MED ORDER — GABAPENTIN 300 MG PO CAPS
300.0000 mg | ORAL_CAPSULE | Freq: Once | ORAL | Status: AC
  Administered 2019-02-05: 11:00:00 300 mg via ORAL
  Filled 2019-02-05: qty 1

## 2019-02-05 MED ORDER — DEXAMETHASONE 4 MG PO TABS
12.0000 mg | ORAL_TABLET | Freq: Once | ORAL | Status: AC
  Administered 2019-02-05: 12 mg via ORAL
  Filled 2019-02-05: qty 3

## 2019-02-05 MED ORDER — IBUPROFEN 200 MG PO TABS
600.0000 mg | ORAL_TABLET | Freq: Once | ORAL | Status: AC
  Administered 2019-02-05: 11:00:00 600 mg via ORAL
  Filled 2019-02-05: qty 3

## 2019-02-05 MED ORDER — HYDROMORPHONE HCL 1 MG/ML IJ SOLN
1.0000 mg | Freq: Once | INTRAMUSCULAR | Status: AC
  Administered 2019-02-05: 09:00:00 1 mg via INTRAMUSCULAR
  Filled 2019-02-05: qty 1

## 2019-02-05 MED ORDER — DIAZEPAM 5 MG PO TABS
10.0000 mg | ORAL_TABLET | Freq: Once | ORAL | Status: AC
  Administered 2019-02-05: 06:00:00 10 mg via ORAL
  Filled 2019-02-05: qty 2

## 2019-02-05 MED ORDER — HYDROCODONE-ACETAMINOPHEN 5-325 MG PO TABS: 1.0000 | ORAL_TABLET | Freq: Four times a day (QID) | ORAL | 0 refills | Status: DC | PRN

## 2019-02-05 MED ORDER — DEXAMETHASONE 4 MG PO TABS: 4.0000 mg | ORAL_TABLET | Freq: Two times a day (BID) | ORAL | 0 refills | Status: DC

## 2019-02-05 MED ORDER — FENTANYL CITRATE (PF) 100 MCG/2ML IJ SOLN
100.0000 ug | Freq: Once | INTRAMUSCULAR | Status: AC
  Administered 2019-02-05: 04:00:00 100 ug via INTRAMUSCULAR
  Filled 2019-02-05: qty 2

## 2019-02-05 MED ORDER — HYDROCODONE-ACETAMINOPHEN 5-325 MG PO TABS
2.0000 | ORAL_TABLET | Freq: Once | ORAL | Status: AC
  Administered 2019-02-05: 2 via ORAL
  Filled 2019-02-05: qty 2

## 2019-02-05 MED ORDER — HYDROCODONE-ACETAMINOPHEN 5-325 MG PO TABS
1.0000 | ORAL_TABLET | Freq: Once | ORAL | Status: AC
  Administered 2019-02-05: 06:00:00 1 via ORAL
  Filled 2019-02-05: qty 1

## 2019-02-05 MED ORDER — DIAZEPAM 5 MG PO TABS
10.0000 mg | ORAL_TABLET | Freq: Once | ORAL | Status: AC
  Administered 2019-02-05: 03:00:00 10 mg via ORAL
  Filled 2019-02-05: qty 2

## 2019-02-05 MED ORDER — DEXAMETHASONE SODIUM PHOSPHATE 10 MG/ML IJ SOLN
10.0000 mg | Freq: Once | INTRAMUSCULAR | Status: AC
  Administered 2019-02-05: 10 mg via INTRAMUSCULAR
  Filled 2019-02-05: qty 1

## 2019-02-05 MED ORDER — LORAZEPAM 1 MG PO TABS
1.0000 mg | ORAL_TABLET | Freq: Once | ORAL | Status: AC
  Administered 2019-02-05: 09:00:00 1 mg via ORAL
  Filled 2019-02-05: qty 1

## 2019-02-05 NOTE — ED Notes (Signed)
Spoke to MRI. MRI will try to get patient around 0920. MRI will call 20 min before coming so this RN can given medications.

## 2019-02-05 NOTE — ED Notes (Addendum)
Pt requesting more pain medication.  Provider made aware.

## 2019-02-05 NOTE — ED Notes (Signed)
Pt transported to MR 

## 2019-02-05 NOTE — ED Notes (Signed)
Pt appears to be more relaxed and his moaning has stopped.

## 2019-02-05 NOTE — ED Provider Notes (Signed)
[facility]-EMERGENCY DEPT Provider Note   CSN: [id number] Arrival date & time: 02/05/19  0031   Time seen 2:20 AM  History   Chief Complaint Chief Complaint  Patient presents with  . Back Pain    HPI Mason Decker is a 48 y.o. male.     HPI patient states he had back surgery about 11 weeks ago.  He states he was doing well.  He states September 19 he was at work and he picked up a dye that weighed about 75 pounds.  He states he felt a "tweak" in his back at that time.  He states the following day, September 20 if he started feeling some pain in his right leg.  He states the pain escalated on the evening of the 21st to the point he is unable to move or walk.  He states the pain radiates in his lower back into his right buttock and down to the toes of his right leg.  He denies any numbness.  He states any type of movement makes it worse, nothing makes it feel better.  He states he was taking hydrocodone but he ran out.  He now is taking Advil.  He denies any difficulty urinating.  He states this is the same pain he had prior to his surgery but now he feels like it is worse.  PCP Rebekah Chesterfield, NP Orthopedics Dr Darrelyn Hillock  Past Medical History:  Diagnosis Date  . Hypertension   . Stroke [facility])     Patient Active Problem List   Diagnosis Date Noted  . Spinal stenosis, lumbar region with neurogenic claudication 11/19/2018  . Hypertensive emergency 03/05/2018  . Tobacco use disorder 03/05/2018  . Alcohol abuse 03/05/2018  . 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 BACK SURGERY  2009  . LUMBAR LAMINECTOMY/DECOMPRESSION MICRODISCECTOMY N/A 11/19/2018   Procedure: Central decompression L5-S1 with foraminotomy L5-S1 root on the right;  Surgeon: [name], MD;  Location: [facility] ORS;  Service: Orthopedics;  Laterality: N/A;    . NO PAST SURGERIES          Home Medications    Prior to Admission medications   Medication Sig Start Date End Date Taking? Authorizing Provider  amLODipine (NORVASC) 10 MG tablet Take 1 tablet (10 mg total) by mouth daily. 03/06/18   Layne Benton, NP  aspirin EC 81 MG EC tablet Take 1 tablet (81 mg total) by mouth daily. 03/06/18   Layne Benton, NP  atorvastatin (LIPITOR) 10 MG tablet Take 1 tablet (10 mg total) by mouth daily at 6 PM. 03/05/18   Layne Benton, NP  HYDROcodone-acetaminophen (NORCO) 10-325 MG tablet Take 1 tablet by mouth every 6 (six) hours as needed for moderate pain. 11/20/18   Constable, Amber, PA-C  lisinopril-hydrochlorothiazide (ZESTORETIC) 10-12.5 MG tablet Take 1 tablet by mouth daily.    [provider]  methocarbamol (ROBAXIN) 500 MG tablet Take 1 tablet (500 mg total) by mouth every 6 (six) hours as needed for muscle spasms. 11/20/18   Dimitri Ped, PA-C    Family History Family History  Problem Relation Age of Onset  . Hypertension Mother   . Hypertension Father     Social History Social History   Tobacco Use  . Smoking status: Current Every Day Smoker    Packs/day: 0.25  Years: 15.00    Pack years: 3.75    Types: Cigarettes  . Smokeless tobacco: Never Used  Substance Use Topics  . Alcohol use: Yes    Alcohol/week: 168.0 standard drinks    Types: 168 Cans of beer per week    Comment: one case of beer per day  . Drug use: Not on file  Employed   Allergies   Patient has no known allergies.   Review of Systems Review of Systems  All other systems reviewed and are negative.    Physical Exam Updated Vital Signs BP (!) 149/74 (BP Location: Right Arm)   Pulse 85   Temp 98.9 F (37.2 C) (Oral)   Resp 16   SpO2 99%   Physical Exam Vitals signs and nursing note reviewed.  Constitutional:      General: He is in acute distress.     Appearance: Normal appearance.  HENT:     Head: Normocephalic and  atraumatic.     Right Ear: External ear normal.     Left Ear: External ear normal.  Eyes:     Extraocular Movements: Extraocular movements intact.     Conjunctiva/sclera: Conjunctivae normal.  Neck:     Musculoskeletal: Normal range of motion.  Cardiovascular:     Rate and Rhythm: Normal rate.  Pulmonary:     Effort: Pulmonary effort is normal. No respiratory distress.  Musculoskeletal:     Comments: Exam is limited because patient is unable to move.  However when I examine his back I had to slide my hand underneath his back, he has tenderness in the lumbar spine diffusely and over the sciatic notch.  He has patellar reflexes 3+ and equal bilaterally.  He is unable to move because of pain.  He is extremely uncomfortable laying on a stretcher.  He has his knee slightly bent.  He is holding himself rigid and still.  Skin:    General: Skin is warm and dry.  Neurological:     General: No focal deficit present.     Mental Status: He is alert and oriented to person, place, and time.     Cranial Nerves: No cranial nerve deficit.  Psychiatric:        Mood and Affect: Mood normal.        Behavior: Behavior normal.        Thought Content: Thought content normal.      ED Treatments / Results  Labs (all labs ordered are listed, but only abnormal results are displayed) Labs Reviewed - No data to display  EKG None  Radiology No results found.  Procedures Procedures (including critical care time)  Medications Ordered in ED Medications  diazepam (VALIUM) tablet 10 mg (has no administration in time range)  ketorolac (TORADOL) 30 MG/ML injection 30 mg (30 mg Intramuscular Given 02/05/19 0245)  dexamethasone (DECADRON) injection 10 mg (10 mg Intramuscular Given 02/05/19 0245)  diazepam (VALIUM) tablet 10 mg (10 mg Oral Given 02/05/19 0248)  fentaNYL (SUBLIMAZE) injection 50 mcg (50 mcg Intramuscular Given 02/05/19 0244)  fentaNYL (SUBLIMAZE) injection 100 mcg (100 mcg Intramuscular Given  02/05/19 0426)     Initial Impression / Assessment and Plan / ED Course  I have reviewed the triage vital signs and the nursing notes.  Pertinent labs & imaging results that were available during my care of the patient were reviewed by me and considered in my medical decision making (see chart for details).     Patient was given medications as listed above,  initially fentanyl 50 mcg IM, Valium 10 mg p.o., Decadron 10 mg IM, and Toradol 30 mg IM.  Recheck at 3:25 AM patient states he is not any better.  However he is noted to be moving his flex knees from side to side.  He was unable to do that when I saw him before.  He is now working on his phone.  4:00 AM patient is requesting more pain medication he was given fentanyl 100 mcg IM.  I felt like patient probably needs a MRI however by the time I arranged [facility] and have him wait in line he probably could have it done quicker here at this facility.  Plus patient is very uncomfortable and probably could not tolerate an ambulance transportation.  6:00 AM MRI is going to do patient shortly, patient states he is claustrophobic.  He was given Valium 10 mg orally.  Patient requested hydrocodone for pain orally.  He was given that prior to MRI.  Pt left with Dr Juleen ChinaKohut at end of shift to get results of his MRI.    Review of the [place] database shows patient has had 20 prescriptions filled since April 16 for mainly hydrocodone varying from 5/325,  7.5/325 and 10/325 and varying amounts from 21 tablets up to 44 tablets.  He also has had 2 prescriptions for Valium 10 mg tablets written twice for 1 pill each time.  Surgery notes from November 19, 2018 POST-OPERATIVE DIAGNOSIS: Spinal Stenosis at L-5-S-1 with Severe Lateral Recess stenosis and Foraminal Stenosis involving the L-5 and S-1 Nerve roots on the Right.  PROCEDURE:  Complete Decompressive Lumbar Laminectomy at L-5-S-1 for Spinal Stenosis and Foraminotomies for the  L-5 and S-1 nerve roots on the Right.Exploration of the L-5-S-1 disc Space on the right  Final Clinical Impressions(s) / ED Diagnoses   Final diagnoses:  Acute right-sided low back pain with right-sided sciatica    Disposition pending  Devoria AlbeIva Keyauna Graefe, MD, Concha PyoFACEP    Tresea Heine, MD 02/05/19 [phone]

## 2019-02-05 NOTE — ED Notes (Signed)
Patient updated on plan of care

## 2019-02-05 NOTE — ED Notes (Signed)
Patient able to ambulate 12 ft with walker.

## 2019-02-05 NOTE — ED Notes (Signed)
Spoke to MRI. They are ready for patient in MRI.

## 2019-02-05 NOTE — Progress Notes (Signed)
[employer] rep and they can ship his Rolling Walker to his home. Contacted pt and phone does not have voice mail. Will have DME shipped to home. [place], [place] ED TOC CM [phone]

## 2019-02-05 NOTE — ED Notes (Signed)
Kohut, MD at bedside. 

## 2019-02-05 NOTE — ED Triage Notes (Signed)
Pt had surgery 11 weeks ago. He picked up something heavy at work and now cant move.

## 2019-02-05 NOTE — ED Notes (Signed)
Patient transported to MRI by tech 

## 2019-02-05 NOTE — Progress Notes (Signed)
Pt severely claustrophobic. Pt given pre-meds prior to coming down to MRI. Pt unable to be placed even half way into closed bore scanner. Pt states he knows he can not be done in a closed scanner. States he needs more meds and A "70cm bore", RN notified and pt returned back to ER.

## 2019-02-05 NOTE — ED Notes (Signed)
Patient refusing to try and walk. States he has not been able to walk since monday. Patient states he tried to walk yesterday and could not.

## 2019-02-05 NOTE — ED Notes (Signed)
Pt requesting hydrocodone. Will notify provider.

## 2019-02-05 NOTE — ED Notes (Signed)
Pt moved in to a stretcher for comfort because the triage chair was hurting his back.

## 2019-02-05 NOTE — ED Notes (Signed)
Spoke to MRI. MRI would like to call this RN back in 15 min to update when they will be ready for patient.

## 2019-02-07 ENCOUNTER — Emergency Department (HOSPITAL_COMMUNITY)
Admission: EM | Admit: 2019-02-07 | Discharge: 2019-02-07 | Discharge status: Absent without leave | Payer: BC Managed Care – PPO

## 2019-02-07 ENCOUNTER — Encounter (HOSPITAL_COMMUNITY): Payer: Self-pay

## 2019-02-07 ENCOUNTER — Other Ambulatory Visit: Payer: Self-pay

## 2019-02-07 ENCOUNTER — Observation Stay (HOSPITAL_COMMUNITY): Payer: BC Managed Care – PPO

## 2019-02-07 ENCOUNTER — Inpatient Hospital Stay (HOSPITAL_COMMUNITY)
Admission: AD | Admit: 2019-02-07 | Discharge: 2019-02-10 | DRG: 455 | Disposition: A | Discharge status: Home / Self Care | Payer: BC Managed Care – PPO | Attending: Orthopedic Surgery | Admitting: Orthopedic Surgery

## 2019-02-07 ENCOUNTER — Ambulatory Visit: Payer: Self-pay | Admitting: Physician Assistant

## 2019-02-07 ENCOUNTER — Other Ambulatory Visit: Payer: Self-pay | Admitting: Physician Assistant

## 2019-02-07 DIAGNOSIS — M545 Low back pain, unspecified: Secondary | ICD-10-CM

## 2019-02-07 DIAGNOSIS — Z01811 Encounter for preprocedural respiratory examination: Secondary | ICD-10-CM

## 2019-02-07 DIAGNOSIS — F1010 Alcohol abuse, uncomplicated: Secondary | ICD-10-CM

## 2019-02-07 DIAGNOSIS — M549 Dorsalgia, unspecified: Secondary | ICD-10-CM | POA: Diagnosis present

## 2019-02-07 DIAGNOSIS — Z79899 Other long term (current) drug therapy: Secondary | ICD-10-CM

## 2019-02-07 DIAGNOSIS — Z20828 Contact with and (suspected) exposure to other viral communicable diseases: Secondary | ICD-10-CM | POA: Diagnosis present

## 2019-02-07 DIAGNOSIS — M5410 Radiculopathy, site unspecified: Secondary | ICD-10-CM

## 2019-02-07 DIAGNOSIS — M48062 Spinal stenosis, lumbar region with neurogenic claudication: Secondary | ICD-10-CM

## 2019-02-07 DIAGNOSIS — M5126 Other intervertebral disc displacement, lumbar region: Secondary | ICD-10-CM | POA: Diagnosis present

## 2019-02-07 DIAGNOSIS — Z79891 Long term (current) use of opiate analgesic: Secondary | ICD-10-CM

## 2019-02-07 DIAGNOSIS — F17200 Nicotine dependence, unspecified, uncomplicated: Secondary | ICD-10-CM

## 2019-02-07 DIAGNOSIS — Z419 Encounter for procedure for purposes other than remedying health state, unspecified: Secondary | ICD-10-CM

## 2019-02-07 DIAGNOSIS — Z7982 Long term (current) use of aspirin: Secondary | ICD-10-CM

## 2019-02-07 DIAGNOSIS — E785 Hyperlipidemia, unspecified: Secondary | ICD-10-CM | POA: Diagnosis present

## 2019-02-07 DIAGNOSIS — M5117 Intervertebral disc disorders with radiculopathy, lumbosacral region: Principal | ICD-10-CM | POA: Diagnosis present

## 2019-02-07 DIAGNOSIS — I161 Hypertensive emergency: Secondary | ICD-10-CM

## 2019-02-07 DIAGNOSIS — Z8249 Family history of ischemic heart disease and other diseases of the circulatory system: Secondary | ICD-10-CM

## 2019-02-07 DIAGNOSIS — K5900 Constipation, unspecified: Secondary | ICD-10-CM | POA: Diagnosis present

## 2019-02-07 DIAGNOSIS — Z8673 Personal history of transient ischemic attack (TIA), and cerebral infarction without residual deficits: Secondary | ICD-10-CM

## 2019-02-07 DIAGNOSIS — Z23 Encounter for immunization: Secondary | ICD-10-CM

## 2019-02-07 DIAGNOSIS — F329 Major depressive disorder, single episode, unspecified: Secondary | ICD-10-CM | POA: Diagnosis present

## 2019-02-07 DIAGNOSIS — I10 Essential (primary) hypertension: Secondary | ICD-10-CM | POA: Diagnosis present

## 2019-02-07 DIAGNOSIS — F17210 Nicotine dependence, cigarettes, uncomplicated: Secondary | ICD-10-CM | POA: Diagnosis present

## 2019-02-07 LAB — SURGICAL PCR SCREEN
MRSA, PCR: NEGATIVE
Staphylococcus aureus: NEGATIVE

## 2019-02-07 MED ORDER — METOPROLOL TARTRATE 5 MG/5ML IV SOLN
5.0000 mg | Freq: Four times a day (QID) | INTRAVENOUS | Status: DC | PRN
  Filled 2019-02-07: qty 5

## 2019-02-07 MED ORDER — HYDROCHLOROTHIAZIDE 12.5 MG PO CAPS
12.5000 mg | ORAL_CAPSULE | Freq: Every day | ORAL | Status: DC
  Administered 2019-02-08 – 2019-02-10 (×3): 12.5 mg via ORAL
  Filled 2019-02-07 (×3): qty 1

## 2019-02-07 MED ORDER — HYDROMORPHONE HCL 2 MG PO TABS
4.0000 mg | ORAL_TABLET | ORAL | Status: DC | PRN
  Administered 2019-02-08 – 2019-02-09 (×6): 4 mg via ORAL
  Filled 2019-02-07 (×6): qty 2

## 2019-02-07 MED ORDER — HYDROMORPHONE HCL 1 MG/ML IJ SOLN
1.0000 mg | INTRAMUSCULAR | Status: DC | PRN
  Administered 2019-02-07 – 2019-02-09 (×6): 1 mg via INTRAVENOUS
  Filled 2019-02-07 (×6): qty 1

## 2019-02-07 MED ORDER — INFLUENZA VAC SPLIT QUAD 0.5 ML IM SUSY
0.5000 mL | PREFILLED_SYRINGE | INTRAMUSCULAR | Status: AC
  Administered 2019-02-08: 0.5 mL via INTRAMUSCULAR
  Filled 2019-02-07: qty 0.5

## 2019-02-07 MED ORDER — HYDROMORPHONE HCL 2 MG/ML IJ SOLN: 1.0000 mg | INTRAMUSCULAR | Status: AC | PRN

## 2019-02-07 MED ORDER — GABAPENTIN 300 MG PO CAPS
600.0000 mg | ORAL_CAPSULE | Freq: Every day | ORAL | Status: DC
  Administered 2019-02-07 – 2019-02-08 (×2): 600 mg via ORAL
  Filled 2019-02-07 (×2): qty 2

## 2019-02-07 MED ORDER — METHOCARBAMOL 500 MG PO TABS: 500.0000 mg | ORAL_TABLET | Freq: Four times a day (QID) | ORAL | Status: AC | PRN

## 2019-02-07 MED ORDER — BISACODYL 5 MG PO TBEC
5.0000 mg | DELAYED_RELEASE_TABLET | Freq: Every day | ORAL | Status: DC | PRN
  Administered 2019-02-07: 5 mg via ORAL
  Filled 2019-02-07: qty 1

## 2019-02-07 MED ORDER — PREDNISONE 10 MG (21) PO TBPK
10.0000 mg | ORAL_TABLET | Freq: Four times a day (QID) | ORAL | Status: DC
  Administered 2019-02-09: 11:00:00 10 mg via ORAL

## 2019-02-07 MED ORDER — HYDROMORPHONE HCL 1 MG/ML IJ SOLN
0.5000 mg | INTRAMUSCULAR | Status: DC | PRN
  Administered 2019-02-07: 0.5 mg via INTRAVENOUS
  Filled 2019-02-07: qty 0.5

## 2019-02-07 MED ORDER — HYDRALAZINE HCL 20 MG/ML IJ SOLN: 10.0000 mg | Freq: Four times a day (QID) | INTRAMUSCULAR | Status: DC | PRN

## 2019-02-07 MED ORDER — PREDNISONE 10 MG (21) PO TBPK
10.0000 mg | ORAL_TABLET | ORAL | Status: AC
  Administered 2019-02-07: 10 mg via ORAL

## 2019-02-07 MED ORDER — AMLODIPINE BESYLATE 10 MG PO TABS
10.0000 mg | ORAL_TABLET | Freq: Every day | ORAL | Status: DC
  Administered 2019-02-07 – 2019-02-10 (×4): 10 mg via ORAL
  Filled 2019-02-07 (×4): qty 1

## 2019-02-07 MED ORDER — HYDRALAZINE HCL 20 MG/ML IJ SOLN
10.0000 mg | Freq: Four times a day (QID) | INTRAMUSCULAR | Status: DC | PRN
  Administered 2019-02-08: 10 mg via INTRAVENOUS
  Filled 2019-02-07: qty 1

## 2019-02-07 MED ORDER — POLYETHYLENE GLYCOL 3350 17 G PO PACK
17.0000 g | PACK | Freq: Every day | ORAL | Status: DC
  Administered 2019-02-07 – 2019-02-08 (×2): 17 g via ORAL
  Filled 2019-02-07 (×3): qty 1

## 2019-02-07 MED ORDER — LISINOPRIL-HYDROCHLOROTHIAZIDE 10-12.5 MG PO TABS: 1.0000 | ORAL_TABLET | Freq: Every day | ORAL | Status: DC

## 2019-02-07 MED ORDER — GABAPENTIN 300 MG PO CAPS
300.0000 mg | ORAL_CAPSULE | Freq: Every day | ORAL | Status: DC
  Administered 2019-02-07 – 2019-02-09 (×3): 300 mg via ORAL
  Filled 2019-02-07 (×3): qty 1

## 2019-02-07 MED ORDER — PREDNISONE 10 MG (21) PO TBPK
10.0000 mg | ORAL_TABLET | Freq: Three times a day (TID) | ORAL | Status: AC
  Administered 2019-02-08 (×3): 10 mg via ORAL

## 2019-02-07 MED ORDER — PREDNISONE 10 MG (21) PO TBPK
20.0000 mg | ORAL_TABLET | Freq: Every evening | ORAL | Status: AC
  Administered 2019-02-08: 23:00:00 20 mg via ORAL
  Filled 2019-02-07: qty 21

## 2019-02-07 MED ORDER — BISACODYL 5 MG PO TBEC
10.0000 mg | DELAYED_RELEASE_TABLET | Freq: Every day | ORAL | Status: DC
  Administered 2019-02-08 – 2019-02-10 (×3): 10 mg via ORAL
  Filled 2019-02-07 (×3): qty 2

## 2019-02-07 MED ORDER — PREDNISONE 10 MG (21) PO TBPK
20.0000 mg | ORAL_TABLET | Freq: Every morning | ORAL | Status: AC
  Administered 2019-02-07: 18:00:00 20 mg via ORAL
  Filled 2019-02-07: qty 21

## 2019-02-07 MED ORDER — PREDNISONE 10 MG (21) PO TBPK
20.0000 mg | ORAL_TABLET | Freq: Every evening | ORAL | Status: AC
  Administered 2019-02-07: 20 mg via ORAL

## 2019-02-07 MED ORDER — LISINOPRIL 10 MG PO TABS
10.0000 mg | ORAL_TABLET | Freq: Every day | ORAL | Status: DC
  Administered 2019-02-08 – 2019-02-10 (×3): 10 mg via ORAL
  Filled 2019-02-07 (×3): qty 1

## 2019-02-07 MED ORDER — SODIUM CHLORIDE 0.9 % IV SOLN
INTRAVENOUS | Status: AC
  Administered 2019-02-09 (×2): via INTRAVENOUS

## 2019-02-07 MED ORDER — DIAZEPAM 5 MG/ML IJ SOLN
5.0000 mg | INTRAMUSCULAR | Status: DC | PRN
  Administered 2019-02-07 – 2019-02-08 (×5): 5 mg via INTRAVENOUS
  Filled 2019-02-07 (×5): qty 2

## 2019-02-07 NOTE — H&P (Signed)
Mason Decker is an 48 y.o. male.   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 A repeat MRI shows a Recurrent HNP  Past Medical History:  Diagnosis Date  . Hypertension   . Stroke [facility])     Past Surgical History:  Procedure Laterality Date  . BACK SURGERY  2009  . LUMBAR LAMINECTOMY/DECOMPRESSION MICRODISCECTOMY N/A 11/19/2018   Procedure: Central decompression L5-S1 with foraminotomy L5-S1 root on the right;  Surgeon: [name], MD;  Location: [facility] ORS;  Service: Orthopedics;  Laterality: N/A;  16min  . NO PAST SURGERIES      Family History  Problem Relation Age of Onset  . Hypertension Mother   . Hypertension Father    Social History:  reports that he has been smoking cigarettes. He has a 3.75 pack-year smoking history. He has never used smokeless tobacco. He reports current alcohol use of about 168.0 standard drinks of alcohol per week. No history on file for drug.  Allergies: No Known Allergies  Medications Prior to Admission  Medication Sig Dispense Refill  . amLODipine (NORVASC) 10 MG tablet Take 1 tablet (10 mg total) by mouth daily. 30 tablet 2  . aspirin EC 81 MG EC tablet Take 1 tablet (81 mg total) by mouth daily.    Marland Kitchen atorvastatin (LIPITOR) 10 MG tablet Take 1 tablet (10 mg total) by mouth daily at 6 PM. 30 tablet 2  . dexamethasone (DECADRON) 4 MG tablet Take 1 tablet (4 mg total) by mouth 2 (two) times daily. 10 tablet 0  . HYDROcodone-acetaminophen (NORCO) 10-325 MG tablet Take 1 tablet by mouth every 6 (six) hours as needed for moderate pain. 30 tablet 0  . HYDROcodone-acetaminophen (NORCO/VICODIN) 5-325 MG tablet Take 1-2 tablets by mouth every 6 (six) hours as needed. 15 tablet 0  . lisinopril-hydrochlorothiazide (ZESTORETIC) 10-12.5 MG tablet Take 1 tablet by mouth daily.    . methocarbamol (ROBAXIN) 500 MG tablet Take 1 tablet (500 mg  total) by mouth every 6 (six) hours as needed for muscle spasms. 40 tablet 0    No results found for this or any previous visit (from the past 48 hour(s)). No results found.  Review of Systems  Constitutional: Negative.   HENT: Negative.   Eyes: Negative.   Respiratory: Negative.   Cardiovascular: Negative.   Gastrointestinal: Negative.   Genitourinary: Negative.   Musculoskeletal: Positive for back pain.  Skin: Negative.   Neurological: Positive for focal weakness.  Endo/Heme/Allergies: Negative.   Psychiatric/Behavioral: Negative.     There were no vitals taken for this visit. Physical Exam  Constitutional: He appears well-developed.  HENT:  Head: Normocephalic.  Eyes: Pupils are equal, round, and reactive to light.  Neck: Normal range of motion.  Cardiovascular: Normal rate.  Respiratory: Effort normal.  GI: Soft.  Musculoskeletal:        General: Tenderness present.     Comments: Severe Low back Pain  Neurological: He is alert.  Skin: Skin is warm.  Psychiatric: He has a normal mood and affect.     Assessment/Plan Admit for Pain Relief and Surgery.  [name], MD 02/07/2019, 3:50 PM

## 2019-02-07 NOTE — Progress Notes (Signed)
Patient is on bedrest

## 2019-02-07 NOTE — Consult Note (Addendum)
Medical Consultation   Mason Decker  KTG:[id number]  DOB: [birth year]  DOA: 02/07/2019  PCP: Rebekah Chesterfield, NP   Requesting physician dr Darrelyn Hillock   Reason for consultation: htn  History of Present Illness: Mason Decker is an 48 y.o. male with history of cva htn admitted for surgery for HNP L5-S1.  Patient had back surgery about 11 weeks ago he started going to work type 75 pounds then he started with a tweak in his back along with pain in the lower back radiating to his right leg.  He was unable to walk.  Denies any urinary incontinence or fecal incontinence.  Denies any fever chills denies any sick contacts.  COVID test is pending.  Denies any abdominal pain nausea vomiting diarrhea.  Indeed he complains of constipation for 5 days.  Has history of high blood pressure.  He he is in a lot of pain when I saw him today.  He had just received 0.5 mg of Dilaudid PT did not touch him.  He was holding onto both sides of the bed with pain.    Review of Systems: positive for back pain constipation  As per HPI otherwise 10 point review of systems negative.  Past Medical History: Past Medical History:  Diagnosis Date  . Hypertension   . Stroke [facility])     Past Surgical History: Past Surgical History:  Procedure Laterality Date  . BACK SURGERY  2009  . LUMBAR LAMINECTOMY/DECOMPRESSION MICRODISCECTOMY N/A 11/19/2018   Procedure: Central decompression L5-S1 with foraminotomy L5-S1 root on the right;  Surgeon: [name], MD;  Location: [facility];  Service: Orthopedics;  Laterality: N/A;   . NO PAST SURGERIES       Allergies:  No Known Allergies   Social History:  reports that he has been smoking cigarettes. He has a 3.75 pack-year smoking history. He has never used smokeless tobacco. He reports current alcohol use of about 168.0 standard drinks of alcohol per week. No history on file for drug.   Family History: Family History  Problem Relation Age of  Onset  . Hypertension Mother   . Hypertension Father       Physical Exam: Vitals:   02/07/19 1559 02/07/19 1608  BP: (!) 173/113 (!) 170/110  Pulse: 89   Resp: 16   Temp: 97.9 F (36.6 C)   SpO2: 100%     Constitutional Alert and awake, oriented x3 in moderate distress due to pain Eyes: PERLA, EOMI, irises appear normal, anicteric sclera,  ENMT: external ears and nose appear normal            Lips appears normal, oropharynx mucosa, tongue, posterior pharynx appear normal  Neck: neck appears normal, no masses, normal ROM, no thyromegaly, no JVD  CVS: S1-S2 clear, no murmur rubs or gallops, no LE edema, normal pedal pulses  Respiratory:  clear to auscultation bilaterally, no wheezing, rales or rhonchi. Respiratory effort normal. No accessory muscle use.  Abdomen: soft nontender, nondistended, normal bowel sounds, no hepatosplenomegaly, no hernias  Musculoskeletal: : no cyanosis, clubbing or edema noted bilaterally                       Neuro: Cranial nerves II-XII intact, strength, sensation, reflexes Psych: judgement and insight appear normal, stable mood and affect, mental status Skin: no rashes or lesions or ulcers, no induration or nodules   Data reviewed:  I have personally reviewed following labs and imaging studies Labs:  CBC: No results for input(s): WBC, NEUTROABS, HGB, HCT, MCV, PLT in the last 168 hours.  Basic Metabolic Panel: No results for input(s): NA, K, CL, CO2, GLUCOSE, BUN, CREATININE, CALCIUM, MG, PHOS in the last 168 hours. GFR CrCl cannot be calculated (Patient's most recent lab result is older than the maximum 21 days allowed.). Liver Function Tests: No results for input(s): AST, ALT, ALKPHOS, BILITOT, PROT, ALBUMIN in the last 168 hours. No results for input(s): LIPASE, AMYLASE in the last 168 hours. No results for input(s): AMMONIA in the last 168 hours. Coagulation profile No results for input(s): INR, PROTIME in the last 168 hours.  Cardiac  Enzymes: No results for input(s): CKTOTAL, CKMB, CKMBINDEX, TROPONINI in the last 168 hours. BNP: Invalid input(s): POCBNP CBG: No results for input(s): GLUCAP in the last 168 hours. D-Dimer No results for input(s): DDIMER in the last 72 hours. Hgb A1c No results for input(s): HGBA1C in the last 72 hours. Lipid Profile No results for input(s): CHOL, HDL, LDLCALC, TRIG, CHOLHDL, LDLDIRECT in the last 72 hours. Thyroid function studies No results for input(s): TSH, T4TOTAL, T3FREE, THYROIDAB in the last 72 hours.  Invalid input(s): FREET3 Anemia work up No results for input(s): VITAMINB12, FOLATE, FERRITIN, TIBC, IRON, RETICCTPCT in the last 72 hours. Urinalysis    Component Value Date/Time   COLORURINE YELLOW 06/11/2007 [place] 06/11/2007 1645   LABSPEC 1.004 (L) 06/11/2007 1645   PHURINE 8.0 06/11/2007 1645   GLUCOSEU NEGATIVE 06/11/2007 1645   HGBUR NEGATIVE 06/11/2007 1645   BILIRUBINUR NEGATIVE 06/11/2007 1645   KETONESUR TRACE (A) 06/11/2007 1645   PROTEINUR NEGATIVE 06/11/2007 1645   UROBILINOGEN 1.0 06/11/2007 1645   NITRITE NEGATIVE 06/11/2007 1645   LEUKOCYTESUR  06/11/2007 1645    NEGATIVE MICROSCOPIC NOT DONE ON URINES WITH NEGATIVE PROTEIN, BLOOD, LEUKOCYTES, NITRITE, OR GLUCOSE <1000 mg/dL.     Microbiology No results found for this or any previous visit (from the past 240 hour(s)).     Inpatient Medications:   Scheduled Meds: . amLODipine  10 mg Oral Daily  . [START ON 02/08/2019] influenza vac split quadrivalent PF  0.5 mL Intramuscular Tomorrow-1000    Radiological Exams on Admission: No results found.  Impression/Recommendations Active Problems:   * No active hospital problems. *   #1 hypertension restart Norvasc 10 mg daily with lisinopril hydrochlorothiazide and PRN Lopressor if needed add PRN hydralazine 10 mg every 6 as needed I believe his pressure will get better with pain control.  #2 L5-S1 moderate sized right-sided  recurrent disc protrusion and shallow central disc protrusion L4-L5 per Ortho  #3 constipation Dulcolax and MiraLAX  #4 hyperlipidemia continue statin  Thank you for this consultation.  Our [facility] hospitalist team will follow the patient with you.  Georgette Shell M.D. [facility] 02/07/2019, 4:51 PM

## 2019-02-08 ENCOUNTER — Encounter (HOSPITAL_COMMUNITY): Payer: Self-pay

## 2019-02-08 DIAGNOSIS — Z79891 Long term (current) use of opiate analgesic: Secondary | ICD-10-CM | POA: Diagnosis not present

## 2019-02-08 DIAGNOSIS — Z79899 Other long term (current) drug therapy: Secondary | ICD-10-CM | POA: Diagnosis not present

## 2019-02-08 DIAGNOSIS — M5126 Other intervertebral disc displacement, lumbar region: Secondary | ICD-10-CM | POA: Diagnosis present

## 2019-02-08 DIAGNOSIS — M545 Low back pain, unspecified: Secondary | ICD-10-CM

## 2019-02-08 DIAGNOSIS — E785 Hyperlipidemia, unspecified: Secondary | ICD-10-CM | POA: Diagnosis present

## 2019-02-08 DIAGNOSIS — Z23 Encounter for immunization: Secondary | ICD-10-CM | POA: Diagnosis not present

## 2019-02-08 DIAGNOSIS — Z01811 Encounter for preprocedural respiratory examination: Secondary | ICD-10-CM | POA: Diagnosis not present

## 2019-02-08 DIAGNOSIS — K5900 Constipation, unspecified: Secondary | ICD-10-CM | POA: Diagnosis present

## 2019-02-08 DIAGNOSIS — M5117 Intervertebral disc disorders with radiculopathy, lumbosacral region: Secondary | ICD-10-CM | POA: Diagnosis present

## 2019-02-08 DIAGNOSIS — Z20828 Contact with and (suspected) exposure to other viral communicable diseases: Secondary | ICD-10-CM | POA: Diagnosis present

## 2019-02-08 DIAGNOSIS — F329 Major depressive disorder, single episode, unspecified: Secondary | ICD-10-CM | POA: Diagnosis present

## 2019-02-08 DIAGNOSIS — I10 Essential (primary) hypertension: Secondary | ICD-10-CM | POA: Diagnosis present

## 2019-02-08 DIAGNOSIS — Z8249 Family history of ischemic heart disease and other diseases of the circulatory system: Secondary | ICD-10-CM | POA: Diagnosis not present

## 2019-02-08 DIAGNOSIS — M549 Dorsalgia, unspecified: Secondary | ICD-10-CM | POA: Diagnosis present

## 2019-02-08 DIAGNOSIS — F17210 Nicotine dependence, cigarettes, uncomplicated: Secondary | ICD-10-CM | POA: Diagnosis present

## 2019-02-08 DIAGNOSIS — Z8673 Personal history of transient ischemic attack (TIA), and cerebral infarction without residual deficits: Secondary | ICD-10-CM | POA: Diagnosis not present

## 2019-02-08 DIAGNOSIS — Z7982 Long term (current) use of aspirin: Secondary | ICD-10-CM | POA: Diagnosis not present

## 2019-02-08 HISTORY — DX: Low back pain, unspecified: M54.50

## 2019-02-08 HISTORY — DX: Low back pain: M54.5

## 2019-02-08 LAB — COMPREHENSIVE METABOLIC PANEL
ALT: 29 U/L (ref 0–44)
AST: 17 U/L (ref 15–41)
Albumin: 4.2 g/dL (ref 3.5–5.0)
Alkaline Phosphatase: 36 U/L — ABNORMAL LOW (ref 38–126)
Anion gap: 9 (ref 5–15)
BUN: 19 mg/dL (ref 6–20)
CO2: 25 mmol/L (ref 22–32)
Calcium: 9.6 mg/dL (ref 8.9–10.3)
Chloride: 103 mmol/L (ref 98–111)
Creatinine, Ser: 0.64 mg/dL (ref 0.61–1.24)
GFR calc Af Amer: >60 mL/min (ref >60)
GFR calc non Af Amer: >60 mL/min (ref >60)
Glucose, Bld: 139 mg/dL — ABNORMAL HIGH (ref 70–99)
Potassium: 4.3 mmol/L (ref 3.5–5.1)
Sodium: 137 mmol/L (ref 135–145)
Total Bilirubin: 0.5 mg/dL (ref 0.3–1.2)
Total Protein: 6.7 g/dL (ref 6.5–8.1)

## 2019-02-08 LAB — CBC
HCT: 46.8 % (ref 39.0–52.0)
Hemoglobin: 15.7 g/dL (ref 13.0–17.0)
MCH: 33.5 pg (ref 26.0–34.0)
MCHC: 33.5 g/dL (ref 30.0–36.0)
MCV: 100 fL (ref 80.0–100.0)
Platelets: 309 10*3/uL (ref 150–400)
RBC: 4.68 MIL/uL (ref 4.22–5.81)
RDW: 12.4 % (ref 11.5–15.5)
WBC: 8.2 10*3/uL (ref 4.0–10.5)
nRBC: 0 % (ref 0.0–0.2)

## 2019-02-08 LAB — SARS CORONAVIRUS 2 (TAT 6-24 HRS): SARS Coronavirus 2: NEGATIVE

## 2019-02-08 MED ORDER — DIAZEPAM 5 MG/ML IJ SOLN
5.0000 mg | INTRAMUSCULAR | Status: DC | PRN
  Administered 2019-02-08 – 2019-02-09 (×2): 5 mg via INTRAVENOUS
  Filled 2019-02-08 (×2): qty 2

## 2019-02-08 NOTE — Plan of Care (Signed)
  Problem: Clinical Measurements: Goal: Ability to maintain clinical measurements within normal limits will improve Outcome: Progressing   Problem: Activity: Goal: Risk for activity intolerance will decrease Outcome: Progressing   Problem: Elimination: Goal: Will not experience complications related to bowel motility Outcome: Progressing   Problem: Pain Managment: Goal: General experience of comfort will improve Outcome: Progressing   Problem: Skin Integrity: Goal: Risk for impaired skin integrity will decrease Outcome: Progressing   

## 2019-02-08 NOTE — Progress Notes (Signed)
Subjective:    Patient reports pain as 7 on 0-10 scale.Some pain relief. Neuro is intact. Note that he had his first Discectomy at L-5-S-1 by me in 2009 and did extremely well until 2020 and in June had his second discectomy at the same level and did well and returned to work. Then about a week ago he lifted a 75 pound weight and had severe pain in his back and right leg and his MRI showed a large recurrent Disc.I had to admit him for pain control and he will need a Discectomy and fusion at L-5-S-1.His Rapid COVID test was Negative yesterday.    Objective: Vital signs in last 24 hours: Temp:  [97.5 F (36.4 C)-97.9 F (36.6 C)] 97.5 F (36.4 C) (09/27 0507) Pulse Rate:  [75-89] 75 (09/27 0507) Resp:  [16-19] 19 (09/27 0507) BP: (122-173)/(89-113) 122/89 (09/27 0507) SpO2:  [97 %-100 %] 100 % (09/27 0507) Weight:  [87.3 kg] 87.3 kg (09/26 1801)  Intake/Output from previous day: 09/26 0701 - 09/27 0700 In: 480 [P.O.:480] Out: 2000 [Urine:2000] Intake/Output this shift: No intake/output data recorded.  Recent Labs    02/08/19 0243  HGB 15.7   Recent Labs    02/08/19 0243  WBC 8.2  RBC 4.68  HCT 46.8  PLT 309   Recent Labs    02/08/19 0243  NA 137  K 4.3  CL 103  CO2 25  BUN 19  CREATININE 0.64  GLUCOSE 139*  CALCIUM 9.6   No results for input(s): LABPT, INR in the last 72 hours.  Neurologically intact Dorsiflexion/Plantar flexion intact   Assessment/Plan:    Up with therapy Plan   is to do a Fusion and Discectomy at L-5-S-1.      [name] 02/08/2019, 8:19 AM

## 2019-02-08 NOTE — Progress Notes (Signed)
Report called to [place] at [facility]. Patient and family aware of pending transfer, SO at bedside. [facility] notified. All belongings w/ patient. Will monitor until transfer.

## 2019-02-08 NOTE — Progress Notes (Signed)
Subjective:     Patient reports pain as moderate.  Patient reports pain is better controlled today than yesterday.  Resting in bed.  Reports that he will need a lumbar fusion and that Dr. Gladstone Lighter is trying to line that up for him.  Unsure of when surgery will be.  Objective:   VITALS:  Temp:  [97.5 F (36.4 C)-97.9 F (36.6 C)] 97.5 F (36.4 C) (09/27 0507) Pulse Rate:  [75-89] 75 (09/27 0507) Resp:  [16-19] 19 (09/27 0507) BP: (122-173)/(89-113) 122/89 (09/27 0507) SpO2:  [97 %-100 %] 100 % (09/27 0507) Weight:  [87.3 kg] 87.3 kg (09/26 1801)  General: WDWN patient in NAD. Psych:  Appropriate mood and affect. Neuro:  A&O x 3, Moving all extremities, sensation intact to light touch HEENT:  EOMs intact Chest:  Even non-labored respirations Skin: no rashes or lesions Extremities: warm/dry, no edema, erythema or echymosis.  No lymphadenopathy. Pulses: Popliteus 2+ MSK:  ROM: TKE bilaterally, MMT: able to perform quad set    LABS Recent Labs    02/08/19 0243  HGB 15.7  WBC 8.2  PLT 309   Recent Labs    02/08/19 0243  NA 137  K 4.3  CL 103  CO2 25  BUN 19  CREATININE 0.64  GLUCOSE 139*   No results for input(s): LABPT, INR in the last 72 hours.   Assessment/Plan:    Recurrent right sided L5-S1 HNP.  Patient seen in rounds for Dr. Gladstone Lighter Continue with pain control. Patient will need additional surgery. Dr. Gladstone Lighter is working on surgery details. HTN control per Medicine team.   Mechele Claude PA-C [facility] Office:  [phone]

## 2019-02-08 NOTE — Progress Notes (Signed)
PROGRESS NOTE    Mason Decker  SHF:[id number] DOB: 12-24-1970 DOA: 02/07/2019 PCP: Adaline Sill, NP Brief Narrative: 48 y.o. male with history of cva htn admitted for surgery for HNP L5-S1.  Patient had back surgery about 11 weeks ago he started going to work type 75 pounds then he started with a tweak in his back along with pain in the lower back radiating to his right leg.  He was unable to walk.  Denies any urinary incontinence or fecal incontinence.  Denies any fever chills denies any sick contacts.  COVID test is pending.  Denies any abdominal pain nausea vomiting diarrhea.  Indeed he complains of constipation for 5 days.  Has history of high blood pressure.  He he is in a lot of pain when I saw him today.  He had just received 0.5 mg of Dilaudid PT did not touch him.  He was holding onto both sides of the bed with pain.  Assessment & Plan:   Active Problems:   Pre-op chest exam   Back pain  #1 hypertension blood pressure better after improved pain control and receiving Norvasc lisinopril and hydrochlorothiazide.  He also received as needed Lopressor.  Continue current management.  #2 L5-S1 moderate sized right-sided recurrent disc protrusion and shallow central disc protrusion L4-L5 per Ortho  #3 constipation Dulcolax and MiraLAX  #4 hyperlipidemia continue statin  Estimated body mass index is 27.62 kg/m as calculated from the following:   Height as of this encounter: 5\' 10"  (1.778 m).   Weight as of this encounter: 87.3 kg.   Subjective: Sitting in bed pain better but not back to baseline  Objective: Vitals:   02/07/19 1801 02/07/19 2141 02/08/19 0507 02/08/19 1347  BP:  (!) 132/99 122/89 (!) 147/97  Pulse:  85 75 94  Resp:  18 19 16   Temp:  97.6 F (36.4 C) (!) 97.5 F (36.4 C) 97.6 F (36.4 C)  TempSrc: Oral Oral Oral   SpO2:  97% 100% 100%  Weight: 87.3 kg     Height: 5\' 10"  (1.778 m)       Intake/Output Summary (Last 24 hours) at 02/08/2019 1520  Last data filed at 02/08/2019 1337 Gross per 24 hour  Intake 960 ml  Output 3700 ml  Net -2740 ml   Filed Weights   02/07/19 1801  Weight: 87.3 kg    Examination:  General exam: Appears calm and comfortable  Respiratory system: Clear to auscultation. Respiratory effort normal. Cardiovascular system: S1 & S2 heard, RRR. No JVD, murmurs, rubs, gallops or clicks. No pedal edema. Gastrointestinal system: Abdomen is nondistended, soft and nontender. No organomegaly or masses felt. Normal bowel sounds heard. Central nervous system: Alert and oriented. No focal neurological deficits. Extremities: Symmetric 5 x 5 power. Skin: No rashes, lesions or ulcers Psychiatry: Judgement and insight appear normal. Mood & affect appropriate.     Data Reviewed: I have personally reviewed following labs and imaging studies  CBC: Recent Labs  Lab 02/08/19 0243  WBC 8.2  HGB 15.7  HCT 46.8  MCV 100.0  PLT 502   Basic Metabolic Panel: Recent Labs  Lab 02/08/19 0243  NA 137  K 4.3  CL 103  CO2 25  GLUCOSE 139*  BUN 19  CREATININE 0.64  CALCIUM 9.6   GFR: Estimated Creatinine Clearance: 119.1 mL/min (by C-G formula based on SCr of 0.64 mg/dL). Liver Function Tests: Recent Labs  Lab 02/08/19 0243  AST 17  ALT 29  ALKPHOS 36*  BILITOT 0.5  PROT 6.7  ALBUMIN 4.2   No results for input(s): LIPASE, AMYLASE in the last 168 hours. No results for input(s): AMMONIA in the last 168 hours. Coagulation Profile: No results for input(s): INR, PROTIME in the last 168 hours. Cardiac Enzymes: No results for input(s): CKTOTAL, CKMB, CKMBINDEX, TROPONINI in the last 168 hours. BNP (last 3 results) No results for input(s): PROBNP in the last 8760 hours. HbA1C: No results for input(s): HGBA1C in the last 72 hours. CBG: No results for input(s): GLUCAP in the last 168 hours. Lipid Profile: No results for input(s): CHOL, HDL, LDLCALC, TRIG, CHOLHDL, LDLDIRECT in the last 72 hours. Thyroid  Function Tests: No results for input(s): TSH, T4TOTAL, FREET4, T3FREE, THYROIDAB in the last 72 hours. Anemia Panel: No results for input(s): VITAMINB12, FOLATE, FERRITIN, TIBC, IRON, RETICCTPCT in the last 72 hours. Sepsis Labs: No results for input(s): PROCALCITON, LATICACIDVEN in the last 168 hours.  Recent Results (from the past 240 hour(s))  SARS CORONAVIRUS 2 (TAT 6-24 HRS) Nasopharyngeal Nasopharyngeal Swab     Status: None   Collection Time: 02/07/19  4:58 PM   Specimen: Nasopharyngeal Swab  Result Value Ref Range Status   SARS Coronavirus 2 NEGATIVE NEGATIVE Final    Comment: (NOTE) SARS-CoV-2 target nucleic acids are NOT DETECTED. The SARS-CoV-2 RNA is generally detectable in upper and lower respiratory specimens during the acute phase of infection. Negative results do not preclude SARS-CoV-2 infection, do not rule out co-infections with other pathogens, and should not be used as the sole basis for treatment or other patient management decisions. Negative results must be combined with clinical observations, patient history, and epidemiological information. The expected result is Negative. Fact Sheet for Patients: HairSlick.no Fact Sheet for Healthcare Providers: quierodirigir.com This test is not yet approved or cleared by the [place] FDA and  has been authorized for detection and/or diagnosis of SARS-CoV-2 by FDA under an Emergency Use Authorization (EUA). This EUA will remain  in effect (meaning this test can be used) for the duration of the COVID-19 declaration under Section 56 4(b)(1) of the Act, 21 U.S.C. section 360bbb-3(b)(1), unless the authorization is terminated or revoked sooner. Performed at [facility], [address], [place], [place] [address]   Surgical pcr screen     Status: None   Collection Time: 02/07/19  5:36 PM   Specimen: Nasal Mucosa; Nasal Swab  Result Value Ref Range Status    MRSA, PCR NEGATIVE NEGATIVE Final   Staphylococcus aureus NEGATIVE NEGATIVE Final    Comment: (NOTE) The Xpert SA Assay (FDA approved for NASAL specimens in patients 72 years of age and older), is one component of a comprehensive surveillance program. It is not intended to diagnose infection nor to guide or monitor treatment. Performed at [facility], [address] [place], [place] [address]          Radiology Studies: Dg Chest Port 1 View  Result Date: 02/07/2019 CLINICAL DATA:  Preop exam. EXAM: PORTABLE CHEST 1 VIEW COMPARISON:  None. FINDINGS: Mild opacity is seen in the left base. Cardiomediastinal silhouette is unremarkable. No pneumothorax. The right lung is clear. IMPRESSION: There is opacity in left base which could represent infiltrate or atelectasis. Recommend clinical correlation and follow-up to resolution. Electronically Signed   By: Gerome Sam III M.D   On: 02/07/2019 18:01        Scheduled Meds: . amLODipine  10 mg Oral Daily  . bisacodyl  10 mg Oral Daily  .  gabapentin  300 mg Oral Daily  . gabapentin  600 mg Oral QHS  . lisinopril  10 mg Oral Daily   And  . hydrochlorothiazide  12.5 mg Oral Daily  . polyethylene glycol  17 g Oral Daily  . predniSONE  10 mg Oral 3 x daily with food  . [START ON 02/09/2019] predniSONE  10 mg Oral 4X daily taper  . predniSONE  20 mg Oral Nightly   Continuous Infusions:   LOS: 0 days     Alwyn RenElizabeth G Gail Creekmore, MD [facility]  If 7PM-7AM, please contact night-coverage [email or web address] Password [email or web address] 02/08/2019, 3:20 PM

## 2019-02-09 ENCOUNTER — Encounter (HOSPITAL_COMMUNITY)
Admission: AD | Disposition: A | Discharge status: Home / Self Care | Payer: Self-pay | Source: Home / Self Care | Attending: Orthopedic Surgery

## 2019-02-09 ENCOUNTER — Encounter (HOSPITAL_COMMUNITY): Payer: Self-pay | Admitting: Anesthesiology

## 2019-02-09 ENCOUNTER — Inpatient Hospital Stay (HOSPITAL_COMMUNITY): Payer: BC Managed Care – PPO | Admitting: Anesthesiology

## 2019-02-09 ENCOUNTER — Inpatient Hospital Stay (HOSPITAL_COMMUNITY): Payer: BC Managed Care – PPO

## 2019-02-09 SURGERY — POSTERIOR LUMBAR FUSION 1 LEVEL
Anesthesia: General | Laterality: Right

## 2019-02-09 MED ORDER — SENNOSIDES-DOCUSATE SODIUM 8.6-50 MG PO TABS: 1.0000 | ORAL_TABLET | Freq: Every evening | ORAL | Status: DC | PRN

## 2019-02-09 MED ORDER — PROPOFOL 10 MG/ML IV BOLUS
INTRAVENOUS | Status: AC
  Filled 2019-02-09: qty 20

## 2019-02-09 MED ORDER — CEFAZOLIN SODIUM-DEXTROSE 2-3 GM-%(50ML) IV SOLR
INTRAVENOUS | Status: DC | PRN
  Administered 2019-02-09 (×2): 2 g via INTRAVENOUS

## 2019-02-09 MED ORDER — SODIUM CHLORIDE 0.9% FLUSH: 3.0000 mL | INTRAVENOUS | Status: DC | PRN

## 2019-02-09 MED ORDER — MORPHINE SULFATE (PF) 2 MG/ML IV SOLN
1.0000 mg | INTRAVENOUS | Status: DC | PRN
  Administered 2019-02-09: 2 mg via INTRAVENOUS
  Administered 2019-02-10: 1 mg via INTRAVENOUS
  Administered 2019-02-10 (×3): 2 mg via INTRAVENOUS
  Filled 2019-02-09 (×5): qty 1

## 2019-02-09 MED ORDER — BUPIVACAINE LIPOSOME 1.3 % IJ SUSP
20.0000 mL | INTRAMUSCULAR | Status: DC
  Filled 2019-02-09: qty 20

## 2019-02-09 MED ORDER — CEFAZOLIN SODIUM 1 G IJ SOLR
INTRAMUSCULAR | Status: AC
  Filled 2019-02-09: qty 20

## 2019-02-09 MED ORDER — FENTANYL CITRATE (PF) 250 MCG/5ML IJ SOLN
INTRAMUSCULAR | Status: AC
  Filled 2019-02-09: qty 5

## 2019-02-09 MED ORDER — LACTATED RINGERS IV SOLN
INTRAVENOUS | Status: DC | PRN
  Administered 2019-02-09 (×2): via INTRAVENOUS

## 2019-02-09 MED ORDER — SODIUM CHLORIDE 0.9 % IV SOLN
INTRAVENOUS | Status: DC | PRN
  Administered 2019-02-09: 50 ug/min via INTRAVENOUS

## 2019-02-09 MED ORDER — DEXAMETHASONE SODIUM PHOSPHATE 10 MG/ML IJ SOLN
INTRAMUSCULAR | Status: DC | PRN
  Administered 2019-02-09: 5 mg via INTRAVENOUS

## 2019-02-09 MED ORDER — CEFAZOLIN SODIUM-DEXTROSE 2-4 GM/100ML-% IV SOLN
2.0000 g | Freq: Three times a day (TID) | INTRAVENOUS | Status: AC
  Administered 2019-02-09 – 2019-02-10 (×2): 2 g via INTRAVENOUS
  Filled 2019-02-09 (×2): qty 100

## 2019-02-09 MED ORDER — HYDROMORPHONE HCL 1 MG/ML IJ SOLN
INTRAMUSCULAR | Status: AC
  Filled 2019-02-09: qty 1

## 2019-02-09 MED ORDER — ATORVASTATIN CALCIUM 10 MG PO TABS: 10.0000 mg | ORAL_TABLET | Freq: Every day | ORAL | Status: DC

## 2019-02-09 MED ORDER — SODIUM CHLORIDE 0.9 % IV SOLN
INTRAVENOUS | Status: DC
  Administered 2019-02-09: 09:00:00 via INTRAVENOUS

## 2019-02-09 MED ORDER — DIAZEPAM 5 MG PO TABS: 5.0000 mg | ORAL_TABLET | Freq: Four times a day (QID) | ORAL | Status: DC | PRN

## 2019-02-09 MED ORDER — FLEET ENEMA 7-19 GM/118ML RE ENEM: 1.0000 | ENEMA | Freq: Once | RECTAL | Status: DC | PRN

## 2019-02-09 MED ORDER — POTASSIUM CHLORIDE IN NACL 20-0.9 MEQ/L-% IV SOLN
INTRAVENOUS | Status: DC
  Administered 2019-02-09: 22:00:00 via INTRAVENOUS
  Filled 2019-02-09: qty 1000

## 2019-02-09 MED ORDER — ACETAMINOPHEN 650 MG RE SUPP: 650.0000 mg | RECTAL | Status: DC | PRN

## 2019-02-09 MED ORDER — ALUM & MAG HYDROXIDE-SIMETH 200-200-20 MG/5ML PO SUSP: 30.0000 mL | Freq: Four times a day (QID) | ORAL | Status: DC | PRN

## 2019-02-09 MED ORDER — MIDAZOLAM HCL 2 MG/2ML IJ SOLN
INTRAMUSCULAR | Status: AC
  Filled 2019-02-09: qty 2

## 2019-02-09 MED ORDER — THROMBIN 20000 UNITS EX SOLR
CUTANEOUS | Status: DC | PRN
  Administered 2019-02-09: 20000 [IU] via TOPICAL

## 2019-02-09 MED ORDER — MIDAZOLAM HCL 5 MG/5ML IJ SOLN
INTRAMUSCULAR | Status: DC | PRN
  Administered 2019-02-09: 2 mg via INTRAVENOUS

## 2019-02-09 MED ORDER — METHYLENE BLUE 0.5 % INJ SOLN
INTRAVENOUS | Status: DC | PRN
  Administered 2019-02-09: .25 mL via SUBMUCOSAL

## 2019-02-09 MED ORDER — DOCUSATE SODIUM 100 MG PO CAPS
100.0000 mg | ORAL_CAPSULE | Freq: Two times a day (BID) | ORAL | Status: DC
  Administered 2019-02-09 – 2019-02-10 (×2): 100 mg via ORAL
  Filled 2019-02-09 (×2): qty 1

## 2019-02-09 MED ORDER — LIDOCAINE 2% (20 MG/ML) 5 ML SYRINGE
INTRAMUSCULAR | Status: DC | PRN
  Administered 2019-02-09: 75 mg via INTRAVENOUS

## 2019-02-09 MED ORDER — HEMOSTATIC AGENTS (NO CHARGE) OPTIME
TOPICAL | Status: DC | PRN
  Administered 2019-02-09: 1 via TOPICAL

## 2019-02-09 MED ORDER — 0.9 % SODIUM CHLORIDE (POUR BTL) OPTIME
TOPICAL | Status: DC | PRN
  Administered 2019-02-09: 14:00:00 1000 mL

## 2019-02-09 MED ORDER — BISACODYL 5 MG PO TBEC: 5.0000 mg | DELAYED_RELEASE_TABLET | Freq: Every day | ORAL | Status: DC | PRN

## 2019-02-09 MED ORDER — PROPOFOL 10 MG/ML IV BOLUS
INTRAVENOUS | Status: DC | PRN
  Administered 2019-02-09: 200 mg via INTRAVENOUS

## 2019-02-09 MED ORDER — LACTATED RINGERS IV SOLN: INTRAVENOUS | Status: DC

## 2019-02-09 MED ORDER — FENTANYL CITRATE (PF) 100 MCG/2ML IJ SOLN
INTRAMUSCULAR | Status: AC
  Filled 2019-02-09: qty 2

## 2019-02-09 MED ORDER — DEXMEDETOMIDINE HCL 200 MCG/2ML IV SOLN
INTRAVENOUS | Status: DC | PRN
  Administered 2019-02-09 (×4): 4 ug via INTRAVENOUS

## 2019-02-09 MED ORDER — PHENOL 1.4 % MT LIQD: 1.0000 | OROMUCOSAL | Status: DC | PRN

## 2019-02-09 MED ORDER — ACETAMINOPHEN 325 MG PO TABS: 650.0000 mg | ORAL_TABLET | ORAL | Status: DC | PRN

## 2019-02-09 MED ORDER — ROCURONIUM BROMIDE 50 MG/5ML IV SOSY
PREFILLED_SYRINGE | INTRAVENOUS | Status: DC | PRN
  Administered 2019-02-09: 60 mg via INTRAVENOUS
  Administered 2019-02-09: 20 mg via INTRAVENOUS

## 2019-02-09 MED ORDER — BUPIVACAINE-EPINEPHRINE 0.25% -1:200000 IJ SOLN
INTRAMUSCULAR | Status: AC
  Filled 2019-02-09: qty 1

## 2019-02-09 MED ORDER — ZOLPIDEM TARTRATE 5 MG PO TABS
5.0000 mg | ORAL_TABLET | Freq: Every evening | ORAL | Status: DC | PRN
  Administered 2019-02-09: 5 mg via ORAL
  Filled 2019-02-09: qty 1

## 2019-02-09 MED ORDER — CEFAZOLIN SODIUM-DEXTROSE 2-4 GM/100ML-% IV SOLN
INTRAVENOUS | Status: AC
  Filled 2019-02-09: qty 100

## 2019-02-09 MED ORDER — THROMBIN 20000 UNITS EX KIT
PACK | CUTANEOUS | Status: AC
  Filled 2019-02-09: qty 1

## 2019-02-09 MED ORDER — HYDROMORPHONE HCL 1 MG/ML IJ SOLN
0.2500 mg | INTRAMUSCULAR | Status: DC | PRN
  Administered 2019-02-09: 0.5 mg via INTRAVENOUS

## 2019-02-09 MED ORDER — SODIUM CHLORIDE 0.9% FLUSH
3.0000 mL | Freq: Two times a day (BID) | INTRAVENOUS | Status: DC
  Administered 2019-02-09: 3 mL via INTRAVENOUS

## 2019-02-09 MED ORDER — SODIUM CHLORIDE 0.9 % IV SOLN: 250.0000 mL | INTRAVENOUS | Status: DC

## 2019-02-09 MED ORDER — MENTHOL 3 MG MT LOZG: 1.0000 | LOZENGE | OROMUCOSAL | Status: DC | PRN

## 2019-02-09 MED ORDER — BUPIVACAINE-EPINEPHRINE 0.25% -1:200000 IJ SOLN
INTRAMUSCULAR | Status: DC | PRN
  Administered 2019-02-09: 5 mL

## 2019-02-09 MED ORDER — ONDANSETRON HCL 4 MG/2ML IJ SOLN: 4.0000 mg | Freq: Four times a day (QID) | INTRAMUSCULAR | Status: DC | PRN

## 2019-02-09 MED ORDER — FENTANYL CITRATE (PF) 100 MCG/2ML IJ SOLN
25.0000 ug | INTRAMUSCULAR | Status: DC | PRN
  Administered 2019-02-09 (×2): 50 ug via INTRAVENOUS

## 2019-02-09 MED ORDER — METHYLENE BLUE 0.5 % INJ SOLN
INTRAVENOUS | Status: AC
  Filled 2019-02-09: qty 10

## 2019-02-09 MED ORDER — PROPOFOL 500 MG/50ML IV EMUL
INTRAVENOUS | Status: DC | PRN
  Administered 2019-02-09: 50 ug/kg/min via INTRAVENOUS
  Administered 2019-02-09 (×2): via INTRAVENOUS

## 2019-02-09 MED ORDER — OXYCODONE-ACETAMINOPHEN 5-325 MG PO TABS
1.0000 | ORAL_TABLET | ORAL | Status: DC | PRN
  Administered 2019-02-09 – 2019-02-10 (×4): 2 via ORAL
  Filled 2019-02-09 (×4): qty 2

## 2019-02-09 MED ORDER — ONDANSETRON HCL 4 MG PO TABS: 4.0000 mg | ORAL_TABLET | Freq: Four times a day (QID) | ORAL | Status: DC | PRN

## 2019-02-09 MED ORDER — FENTANYL CITRATE (PF) 100 MCG/2ML IJ SOLN
INTRAMUSCULAR | Status: DC | PRN
  Administered 2019-02-09 (×5): 50 ug via INTRAVENOUS
  Administered 2019-02-09: 100 ug via INTRAVENOUS
  Administered 2019-02-09 (×3): 50 ug via INTRAVENOUS

## 2019-02-09 SURGICAL SUPPLY — 87 items
AGENT HMST KT MTR STRL THRMB (HEMOSTASIS)
APL SKNCLS STERI-STRIP NONHPOA (GAUZE/BANDAGES/DRESSINGS) ×1
BENZOIN TINCTURE PRP APPL 2/3 (GAUZE/BANDAGES/DRESSINGS) ×2 IMPLANT
BLADE CLIPPER SURG (BLADE) IMPLANT
BONE VIVIGEN FORMABLE 10CC (Bone Implant) ×2 IMPLANT
BUR PRESCISION 1.7 ELITE (BURR) ×2 IMPLANT
BUR ROUND FLUTED 5 RND (BURR) ×2 IMPLANT
BUR ROUND PRECISION 4.0 (BURR) IMPLANT
BUR SABER RD CUTTING 3.0 (BURR) IMPLANT
CAGE CONCORDE BULLET 9X9X27 (Cage) ×1 IMPLANT
CARTRIDGE OIL MAESTRO DRILL (MISCELLANEOUS) ×1 IMPLANT
CONT SPEC 4OZ CLIKSEAL STRL BL (MISCELLANEOUS) ×2 IMPLANT
COVER MAYO STAND STRL (DRAPES) ×4 IMPLANT
COVER SURGICAL LIGHT HANDLE (MISCELLANEOUS) ×2 IMPLANT
COVER WAND RF STERILE (DRAPES) ×2 IMPLANT
DIFFUSER DRILL AIR PNEUMATIC (MISCELLANEOUS) ×2 IMPLANT
DRAIN CHANNEL 15F RND FF W/TCR (WOUND CARE) IMPLANT
DRAPE C-ARM 42X72 X-RAY (DRAPES) ×2 IMPLANT
DRAPE C-ARMOR (DRAPES) IMPLANT
DRAPE POUCH INSTRU U-SHP 10X18 (DRAPES) ×2 IMPLANT
DRAPE SURG 17X23 STRL (DRAPES) ×8 IMPLANT
DURAPREP 26ML APPLICATOR (WOUND CARE) ×2 IMPLANT
ELECT BLADE 4.0 EZ CLEAN MEGAD (MISCELLANEOUS) ×2
ELECT CAUTERY BLADE 6.4 (BLADE) ×2 IMPLANT
ELECT REM PT RETURN 9FT ADLT (ELECTROSURGICAL) ×2
ELECTRODE BLDE 4.0 EZ CLN MEGD (MISCELLANEOUS) ×1 IMPLANT
ELECTRODE REM PT RTRN 9FT ADLT (ELECTROSURGICAL) ×1 IMPLANT
EVACUATOR SILICONE 100CC (DRAIN) IMPLANT
FEE INTRAOP MONITOR IMPULS NCS (MISCELLANEOUS) IMPLANT
FILTER STRAW FLUID ASPIR (MISCELLANEOUS) ×2 IMPLANT
GAUZE 4X4 16PLY RFD (DISPOSABLE) ×2 IMPLANT
GAUZE SPONGE 4X4 12PLY STRL (GAUZE/BANDAGES/DRESSINGS) ×2 IMPLANT
GLOVE BIO SURGEON STRL SZ7 (GLOVE) ×2 IMPLANT
GLOVE BIO SURGEON STRL SZ8 (GLOVE) ×2 IMPLANT
GLOVE BIOGEL PI IND STRL 7.0 (GLOVE) ×1 IMPLANT
GLOVE BIOGEL PI IND STRL 8 (GLOVE) ×1 IMPLANT
GLOVE BIOGEL PI INDICATOR 7.0 (GLOVE) ×1
GLOVE BIOGEL PI INDICATOR 8 (GLOVE) ×1
GOWN STRL REUS W/ TWL LRG LVL3 (GOWN DISPOSABLE) ×2 IMPLANT
GOWN STRL REUS W/ TWL XL LVL3 (GOWN DISPOSABLE) ×1 IMPLANT
GOWN STRL REUS W/TWL LRG LVL3 (GOWN DISPOSABLE) ×4
GOWN STRL REUS W/TWL XL LVL3 (GOWN DISPOSABLE) ×2
GRAFT BNE MATRIX VG FRMBL L 10 (Bone Implant) IMPLANT
INTRAOP MONITOR FEE IMPULS NCS (MISCELLANEOUS) ×1
INTRAOP MONITOR FEE IMPULSE (MISCELLANEOUS) ×1
IV CATH 14GX2 1/4 (CATHETERS) ×2 IMPLANT
KIT BASIN OR (CUSTOM PROCEDURE TRAY) ×2 IMPLANT
KIT POSITION SURG JACKSON T1 (MISCELLANEOUS) ×2 IMPLANT
KIT TURNOVER KIT B (KITS) ×2 IMPLANT
MARKER SKIN DUAL TIP RULER LAB (MISCELLANEOUS) ×4 IMPLANT
NDL 18GX1X1/2 (RX/OR ONLY) (NEEDLE) ×1 IMPLANT
NDL HYPO 25GX1X1/2 BEV (NEEDLE) ×1 IMPLANT
NDL SPNL 18GX3.5 QUINCKE PK (NEEDLE) ×2 IMPLANT
NEEDLE 18GX1X1/2 (RX/OR ONLY) (NEEDLE) ×2 IMPLANT
NEEDLE 22X1 1/2 (OR ONLY) (NEEDLE) ×4 IMPLANT
NEEDLE HYPO 25GX1X1/2 BEV (NEEDLE) ×2 IMPLANT
NEEDLE SPNL 18GX3.5 QUINCKE PK (NEEDLE) ×4 IMPLANT
NS IRRIG 1000ML POUR BTL (IV SOLUTION) ×2 IMPLANT
OIL CARTRIDGE MAESTRO DRILL (MISCELLANEOUS) ×2
PACK LAMINECTOMY ORTHO (CUSTOM PROCEDURE TRAY) ×2 IMPLANT
PACK UNIVERSAL I (CUSTOM PROCEDURE TRAY) ×2 IMPLANT
PAD ARMBOARD 7.5X6 YLW CONV (MISCELLANEOUS) ×4 IMPLANT
PATTIES SURGICAL .5 X1 (DISPOSABLE) ×2 IMPLANT
PATTIES SURGICAL .5X1.5 (GAUZE/BANDAGES/DRESSINGS) ×2 IMPLANT
PROBE PEDCLE PROBE MAGSTM DISP (MISCELLANEOUS) ×1 IMPLANT
ROD PRE BENT EXP 40MM (Rod) ×2 IMPLANT
SCREW CORTICAL VIPER 7X40MM (Screw) ×2 IMPLANT
SCREW SET SINGLE INNER (Screw) ×4 IMPLANT
SCREW VIPER CORT FIX 6X35 (Screw) ×2 IMPLANT
SPONGE INTESTINAL PEANUT (DISPOSABLE) ×3 IMPLANT
SPONGE SURGIFOAM ABS GEL 100 (HEMOSTASIS) ×2 IMPLANT
STRIP CLOSURE SKIN 1/2X4 (GAUZE/BANDAGES/DRESSINGS) ×4 IMPLANT
SURGIFLO W/THROMBIN 8M KIT (HEMOSTASIS) IMPLANT
SUT MNCRL AB 4-0 PS2 18 (SUTURE) ×2 IMPLANT
SUT VIC AB 0 CT1 18XCR BRD 8 (SUTURE) ×1 IMPLANT
SUT VIC AB 0 CT1 8-18 (SUTURE) ×2
SUT VIC AB 1 CT1 18XCR BRD 8 (SUTURE) ×1 IMPLANT
SUT VIC AB 1 CT1 8-18 (SUTURE) ×2
SUT VIC AB 2-0 CT2 18 VCP726D (SUTURE) ×2 IMPLANT
SYR 20ML LL LF (SYRINGE) ×4 IMPLANT
SYR BULB IRRIGATION 50ML (SYRINGE) ×2 IMPLANT
SYR CONTROL 10ML LL (SYRINGE) ×4 IMPLANT
SYR TB 1ML LUER SLIP (SYRINGE) ×2 IMPLANT
TAPE CLOTH SURG 6X10 WHT LF (GAUZE/BANDAGES/DRESSINGS) ×1 IMPLANT
TRAY FOLEY MTR SLVR 16FR STAT (SET/KITS/TRAYS/PACK) ×2 IMPLANT
WATER STERILE IRR 1000ML POUR (IV SOLUTION) ×2 IMPLANT
YANKAUER SUCT BULB TIP NO VENT (SUCTIONS) ×2 IMPLANT

## 2019-02-09 NOTE — Consult Note (Signed)
Reason for Consult:Severe right leg pain Referring Physician: Dr. Heloise Decker is an 48 y.o. male, whom Dr. Darrelyn Decker contacted me over the weekend about, regarding his severe right leg pain. Of note, he did just undergo his second L5-S1 decompression by Dr. Darrelyn Decker. This was approximately 3 months ago.  He did well initially after surgery, but recently had a substantial increase in pain in his back, and then involving the right leg.  Presently, the patient's right leg pain is constant and severe.  He is unable to perform activities of daily living.  As a result, Dr. Darrelyn Decker did admit him for pain control.  The pain involves the posterior buttock and thigh.  It is slightly better with medication, but continues to be severe and constant.  He does feel debilitated.  Past Medical History:  Diagnosis Date  . Hypertension   . Severe lumbar pain 02/08/2019  . Stroke [facility])     Past Surgical History:  Procedure Laterality Date  . BACK SURGERY  2009  . LUMBAR LAMINECTOMY/DECOMPRESSION MICRODISCECTOMY N/A 11/19/2018   Procedure: Central decompression L5-S1 with foraminotomy L5-S1 root on the right;  Surgeon: [name], MD;  Location: [facility] ORS;  Service: Orthopedics;  Laterality: N/A;   . NO PAST SURGERIES      Family History  Problem Relation Age of Onset  . Hypertension Mother   . Hypertension Father     Social History:  reports that he has been smoking cigarettes. He has a 3.75 pack-year smoking history. He has never used smokeless tobacco. He reports current alcohol use of about 168.0 standard drinks of alcohol per week. No history on file for drug.  Allergies: No Known Allergies  Medications: I have reviewed the patient's current medications.  Results for orders placed or performed during the hospital encounter of 02/07/19 (from the past 48 hour(s))  SARS CORONAVIRUS 2 (TAT 6-24 HRS) Nasopharyngeal Nasopharyngeal Swab     Status: None   Collection Time: 02/07/19  4:58  PM   Specimen: Nasopharyngeal Swab  Result Value Ref Range   SARS Coronavirus 2 NEGATIVE NEGATIVE    Comment: (NOTE) SARS-CoV-2 target nucleic acids are NOT DETECTED. The SARS-CoV-2 RNA is generally detectable in upper and lower respiratory specimens during the acute phase of infection. Negative results do not preclude SARS-CoV-2 infection, do not rule out co-infections with other pathogens, and should not be used as the sole basis for treatment or other patient management decisions. Negative results must be combined with clinical observations, patient history, and epidemiological information. The expected result is Negative. Fact Sheet for Patients: HairSlick.no Fact Sheet for Healthcare Providers: quierodirigir.com This test is not yet approved or cleared by the [place] FDA and  has been authorized for detection and/or diagnosis of SARS-CoV-2 by FDA under an Emergency Use Authorization (EUA). This EUA will remain  in effect (meaning this test can be used) for the duration of the COVID-19 declaration under Section 56 4(b)(1) of the Act, 21 U.S.C. section 360bbb-3(b)(1), unless the authorization is terminated or revoked sooner. Performed at [facility], [address], [place], [place] [address]   Surgical pcr screen     Status: None   Collection Time: 02/07/19  5:36 PM   Specimen: Nasal Mucosa; Nasal Swab  Result Value Ref Range   MRSA, PCR NEGATIVE NEGATIVE   Staphylococcus aureus NEGATIVE NEGATIVE    Comment: (NOTE) The Xpert SA Assay (FDA approved for NASAL specimens in patients 24 years of age and older),  is one component of a comprehensive surveillance program. It is not intended to diagnose infection nor to guide or monitor treatment. Performed at [facility], [address] [place], [place] [address]   CBC     Status: None   Collection Time: 02/08/19  2:43 AM  Result Value Ref  Range   WBC 8.2 4.0 - 10.5 K/uL   RBC 4.68 4.22 - 5.81 MIL/uL   Hemoglobin 15.7 13.0 - 17.0 g/dL   HCT 60.446.8 54.039.0 - 98.152.0 %   MCV 100.0 80.0 - 100.0 fL   MCH 33.5 26.0 - 34.0 pg   MCHC 33.5 30.0 - 36.0 g/dL   RDW 19.112.4 47.811.5 - 29.515.5 %   Platelets 309 150 - 400 K/uL   nRBC 0.0 0.0 - 0.2 %    Comment: Performed at [facility], [address] [place], [place] [address]  Comprehensive metabolic panel     Status: Abnormal   Collection Time: 02/08/19  2:43 AM  Result Value Ref Range   Sodium 137 135 - 145 mmol/L   Potassium 4.3 3.5 - 5.1 mmol/L   Chloride 103 98 - 111 mmol/L   CO2 25 22 - 32 mmol/L   Glucose, Bld 139 (H) 70 - 99 mg/dL   BUN 19 6 - 20 mg/dL   Creatinine, Ser 8.650.64 0.61 - 1.24 mg/dL   Calcium 9.6 8.9 - 78.410.3 mg/dL   Total Protein 6.7 6.5 - 8.1 g/dL   Albumin 4.2 3.5 - 5.0 g/dL   AST 17 15 - 41 U/L   ALT 29 0 - 44 U/L   Alkaline Phosphatase 36 (L) 38 - 126 U/L   Total Bilirubin 0.5 0.3 - 1.2 mg/dL   GFR calc non Af Amer >60 >60 mL/min   GFR calc Af Amer >60 >60 mL/min   Anion gap 9 5 - 15    Comment: Performed at [facility], [address] [place], [place] [address]    Dg Chest Port 1 View  Result Date: 02/07/2019 CLINICAL DATA:  Preop exam. EXAM: PORTABLE CHEST 1 VIEW COMPARISON:  None. FINDINGS: Mild opacity is seen in the left base. Cardiomediastinal silhouette is unremarkable. No pneumothorax. The right lung is clear. IMPRESSION: There is opacity in left base which could represent infiltrate or atelectasis. Recommend clinical correlation and follow-up to resolution. Electronically Signed   By: Gerome Samavid  Zhen III M.D   On: 02/07/2019 18:01    Review of Systems  Eyes: Negative.   Respiratory: Negative.   Cardiovascular: Negative.   Gastrointestinal: Negative.   Genitourinary: Negative.   Endo/Heme/Allergies: Negative.   Psychiatric/Behavioral: Negative.    Blood pressure 131/73, pulse 74, temperature 97.6 F (36.4 C),  temperature source Oral, resp. rate 16, height 5\' 10"  (1.778 m), weight 87.3 kg, SpO2 98 %. Physical Exam  Constitutional: He is oriented to person, place, and time. He appears well-developed and well-nourished.  This is a very pleasant and cooperative patient  HENT:  Head: Normocephalic.  Eyes: Pupils are equal, round, and reactive to light.  Neck: Normal range of motion.  Respiratory: Effort normal.  GI: Soft.  Musculoskeletal: Normal range of motion.  Neurological: He is oriented to person, place, and time.  Patient is clearly uncomfortable with a markedly positive straight leg raise on the right. He has a well-healed posterior midline incision overlying the L5-S1 level.  Skin: Skin is warm and dry.  Psychiatric: He has a normal mood and affect. His behavior is normal.  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  I did talk through her options with the patient.  As of now, he does have surgery scheduled with Dr. Rolena Infante in about 2 weeks.  He does not feel able to wait, as his pain is severe and miserable.  I did reach out to Dr. Rolena Infante did discuss the situation with him, and he did state that he would be on board with me proceeding with surgery sooner if I felt a different approach would be reasonable.  To this end, I did discuss the utility of a revision decompression and right-sided L5-S1 transforaminal lumbar interbody fusion, with instrumentation and allograft. As he does understand, a fusion is indicated, as this would not be his third decompression at the L5-S1 level.   We extensively discussed the risks and recovery period associated with surgery.  The patient understands that the risks include infection, nerve injury (which may result in permanent numbness, weakness or pain), recurrent stenosis, lack of resolution of back pain, postoperative  instability, bleeding, cerebral spinal fluid leak, possible lack of resolution of leg pain, nonunion, failure of instrumentation, DVT.  The patient understands that he would likely remain in the hospital for 1-2  days following surgery, and that full recovery would likely take 6-9 months. The patient understands to avoid any bending lifting or twisting for 3 months.  After a full understanding of the risks, benefits, and recovery associated with surgery, the patient does wish to proceed.  As of now, I have spoken with the operating room staff.  We are able to get him into surgery this afternoon.  He will be maintained nothing by mouth, and he was reminded of this.  He states that he does have a brace at home.  He will get this to his bedside prior to surgery today.  I will evaluate it at his bedside, and if it looks like his brace is appropriate, he will need to wear for 6 weeks.  The brace may need to be revised, as I do feel that a TLSO brace is indicated.  Of note, he does smoke.  He does understand that status does increase his risk for a nonunion.  He did express interest in potentially quitting, as he states he smokes 4-5 cigarettes per day, and that he can possibly quit.  All his questions were answered.  I will see him next in surgery.  The patient's situation was also discussed with Dr. Gladstone Lighter.   Kenidi Elenbaas L Airyonna Franklyn 02/09/2019, 7:13 AM

## 2019-02-09 NOTE — Op Note (Signed)
PATIENT NAME: Mason Decker   MEDICAL RECORD NO.:   [id number]   DATE OF BIRTH: [birth year]   DATE OF PROCEDURE: 02/09/2019                               OPERATIVE REPORT   PREOPERATIVE DIAGNOSES: 1. Right S1 radiculopathy, resulting in severe right leg pain 2. Recurrent right L5-S1 disc herniation 3. Status post 2 previous L5/S1 decompressions   POSTOPERATIVE DIAGNOSES: 1. Right S1 radiculopathy, resulting in severe right leg pain 2. Recurrent right L5-S1 disc herniation 3. Status post 2 previous L5/S1 decompressions   PROCEDURES:  1. Revision decompression L5/S1 1. Right-sided L5-S1 transforaminal lumbar interbody fusion. 2. Left-sided L5-S1 posterolateral fusion. 3. Insertion of interbody device x1 (9 x 27 mm Concorde intervertebral spacer). 4. Placement of posterior instrumentation at L5, S1 bilaterally. 5. Use of local autograft. 6. Use of morselized allograft - ViviGen. 7. Intraoperative use of fluoroscopy.   SURGEON:  Phylliss Bob, MD.   ASSISTANTPricilla Holm, PA-C.   ANESTHESIA:  General endotracheal anesthesia.   COMPLICATIONS:  None.   DISPOSITION:  Stable.   ESTIMATED BLOOD LOSS:   Minimal   INDICATIONS FOR SURGERY:  Briefly, Mr. Burgueno is a pleasant 48 year old male whom I evaluated earlier today with severe and ongoing pain in the right leg.  He was admitted by Dr. Gladstone Lighter due to his severe pain, and Dr. Gladstone Lighter did consult to evaluate the patient for consideration for possible surgical intervention, which I was happy to do. Please see my consult note for additional details. We did have an extensive discussion about surgery, and he did elect to proceed with the procedure noted above.      OPERATIVE DETAILS:  On 02/09/2019, the patient was brought to surgery and general endotracheal anesthesia was administered.  The patient was placed prone on a well-padded flat Jackson bed with a spinal frame.  Antibiotics were given and a time-out procedure was  performed. The back was prepped and draped in the usual fashion.  A midline incision was made overlying the L5-S1 intervertebral space.  The fascia was incised at the midline.  The paraspinal musculature was bluntly swept laterally.  Anatomic landmarks for the pedicles were exposed. Using fluoroscopy, I did cannulate the L5 and S1 pedicles bilaterally, using a medial to lateral cortical trajectory technique.  On the left side, the posterolateral gutter and left facet joint at L5/S1 was decorticated and 6  and 80mm screws were placed into the L5 and S1 pedicles and a 40-mm rod was placed and distraction was applied across the rod.  On the right side, the cannulated pedicle holes were filled with bone wax.  I then proceeded with the decompressive aspect of the procedure.  On the right side, there was abundant scar in the region of the lateral recess. I worked around the scar and extended the laminectomy circumferentially. This was meticulous and very time-consuming, taking about  60 minutes to perform safely A full facetectomy was performed. The S1 nerve was noted, and under obvious tension. I was able to develop a plane between the nerve and disc hernation, and multiple large disc fragments were moved, as was the posterolateral anulus, this did entirely decompress the nerve. This was also a very meticulous and time-consuming portion of the procedure, given the extensive scar and adhesions about the the nerve, however I was very happy with the decompression With an assistant holding medial retraction of  the traversing right S1 nerve, I used a series of curettes and pituitary rongeurs to perform a thorough and complete intervertebral diskectomy.  The intervertebral space was then liberally packed with autograft as well as allograft in the form of ViviGen, as was the appropriate-sized intervertebral spacer.  The spacer was then tamped into position in the usual fashion.  I was very pleased with  the press-fit of the spacer.  I then placed a 6 x 35 mm screw on the right at L5 and a 7 x 84mm screw at S1.  A 40-mm rod was then placed and caps were placed. The distraction was then released on the contralateral left side.  All 4 caps were then locked.  The wound was copiously irrigated with a total of approximately 3 L prior to placing the bone graft.  Additional autograft and allograft were then packed into the posterolateral gutter on the right side to help aid in the L5-S1 fusion.  The wound was explored for any undue bleeding and there was no substantial bleeding encountered.  Gel-Foam was placed over the laminectomy site.  The wound was then closed in layers using #1 Vicryl followed by 2-0 Vicryl, followed by 4-0 Monocryl.  Benzoin and Steri-Strips were applied followed by sterile dressing.  Of note, I did use triggered EMG to test the screws on the left, and there was no screw that tested below 20 mA.  There was no abnormal EMG activity noted throughout the entire surgery.   Of note, Jason Coop was my assistant throughout surgery, and did aid in retraction, suctioning, and closure.     Estill Bamberg, MD

## 2019-02-09 NOTE — Anesthesia Preprocedure Evaluation (Addendum)
Anesthesia Evaluation  Patient identified by MRN, date of birth, ID band Patient awake    Reviewed: Allergy & Precautions, NPO status , Patient's Chart, lab work & pertinent test results  Airway Mallampati: II  TM Distance: >3 FB Neck ROM: full    Dental  (+) Teeth Intact, Dental Advidsory Given   Pulmonary Current Smoker and Patient abstained from smoking.,    breath sounds clear to auscultation       Cardiovascular hypertension, Pt. on medications  Rhythm:regular Rate:Normal     Neuro/Psych PSYCHIATRIC DISORDERS Depression CVA    GI/Hepatic negative GI ROS, Neg liver ROS,   Endo/Other  negative endocrine ROS  Renal/GU negative Renal ROS     Musculoskeletal   Abdominal   Peds  Hematology   Anesthesia Other Findings   Reproductive/Obstetrics                            Anesthesia Physical Anesthesia Plan  ASA: III  Anesthesia Plan: General   Post-op Pain Management:    Induction: Intravenous  PONV Risk Score and Plan: 2 and Dexamethasone, Midazolam and Ondansetron  Airway Management Planned: Oral ETT  Additional Equipment:   Intra-op Plan:   Post-operative Plan: Possible Post-op intubation/ventilation  Informed Consent: I have reviewed the patients History and Physical, chart, labs and discussed the procedure including the risks, benefits and alternatives for the proposed anesthesia with the patient or authorized representative who has indicated his/her understanding and acceptance.     Dental Advisory Given and Dental advisory given  Plan Discussed with: CRNA and Anesthesiologist  Anesthesia Plan Comments:        Anesthesia Quick Evaluation

## 2019-02-09 NOTE — Transfer of Care (Signed)
Immediate Anesthesia Transfer of Care Note  Patient: Mason Decker  Procedure(s) Performed: RIGHT SIDED LUMBAR 5-SACRUM 1 revision decompression and  Transforaminal  INTERBODY FUSION (Right )  Patient Location: PACU  Anesthesia Type:General  Level of Consciousness: awake, alert  and oriented  Airway & Oxygen Therapy: Patient Spontanous Breathing and Patient connected to face mask oxygen  Post-op Assessment: Report given to RN, Post -op Vital signs reviewed and stable and Patient moving all extremities X 4  Post vital signs: Reviewed and stable  Last Vitals:  Vitals Value Taken Time  BP 151/88 02/09/19 1838  Temp    Pulse 109 02/09/19 1839  Resp 39 02/09/19 1839  SpO2 100 % 02/09/19 1839  Vitals shown include unvalidated device data.  Last Pain:  Vitals:   02/09/19 1104  TempSrc:   PainSc: 9       Patients Stated Pain Goal: 3 (48/27/07 8675)  Complications: No apparent anesthesia complications

## 2019-02-09 NOTE — Anesthesia Procedure Notes (Signed)
Procedure Name: Intubation Date/Time: 02/09/2019 12:57 PM Performed by: Neldon Newport, CRNA Pre-anesthesia Checklist: Timeout performed, Patient being monitored, Suction available, Emergency Drugs available and Patient identified Patient Re-evaluated:Patient Re-evaluated prior to induction Oxygen Delivery Method: Circle system utilized Preoxygenation: Pre-oxygenation with 100% oxygen Induction Type: IV induction and Cricoid Pressure applied Ventilation: Mask ventilation without difficulty and Oral airway inserted - appropriate to patient size Laryngoscope Size: Mac, 4, Glidescope and 3 (Grade 3 view with Mac 4) Tube type: Oral Tube size: 7.5 mm Number of attempts: 2 Placement Confirmation: ETT inserted through vocal cords under direct vision,  breath sounds checked- equal and bilateral and positive ETCO2 Secured at: 24 cm Tube secured with: Tape Dental Injury: Teeth and Oropharynx as per pre-operative assessment  Difficulty Due To: Difficulty was unanticipated

## 2019-02-10 NOTE — Progress Notes (Signed)
Discharge  Pt was able to participate with discharge teaching with girlfriend at the bedside. PIV removed and pt was assisted to dress with girlfriend. Pt was given instruction on dressing, mobility and precautions. Pt informed of f/u appt and medication changes.

## 2019-02-10 NOTE — Progress Notes (Signed)
    Patient doing well postop day 1 from his revision decompression and lumbar fusion at L5-S1.  He reports complete resolution of his preoperative leg pain.  He could not be more pleased.  He denies any residual symptoms in the leg.  He has mild to moderate expected postoperative low back pain.  He states that he is not yet been out of bed.  He is eager to progress his activities.  His TLSO brace has been delivered to the room.  He has been having normal bowel and bladder function.  He denies nausea or vomiting.  He is eating breakfast comfortably.   Physical Exam: Vitals:   02/10/19 0814 02/10/19 0815  BP: (!) 135/104 (!) 141/99  Pulse: 88 87  Resp: 16   Temp: 98 F (36.7 C)   SpO2: 100%     Dressing in place, CDI, patient is sitting up in bed eating breakfast comfortably.  SCDs are in place.  TLSO brace is at bedside and was modified to place the straps in the correct position. NVI  POD #1 s/p revision L5-S1 decompression with transforaminal lumbar interbody fusion  - radicular symptomatology entirely resolved with minimal expected postoperative low back pain well-controlled with current medications - up with PT/OT, encourage ambulation  -TLSO brace is to be worn at all times when out of bed.  Straps go under the arms. - patient needs to be ambulated at a minimum once every shift.  Patient needs to be out of bed as soon as possible as he has not ambulated since his surgery. - Percocet for pain, Valium for muscle spasms - likely d/c home today versus tomorrow pending progress in physical therapy with f/u in 2 weeks at our office - discharge instructions and postoperative appointment card are in the patient's chart

## 2019-02-10 NOTE — Plan of Care (Signed)
  Problem: Clinical Measurements: Goal: Ability to maintain clinical measurements within normal limits will improve Outcome: Adequate for Discharge Goal: Will remain free from infection Outcome: Adequate for Discharge   Problem: Activity: Goal: Risk for activity intolerance will decrease Outcome: Adequate for Discharge   Problem: Coping: Goal: Level of anxiety will decrease Outcome: Adequate for Discharge   Problem: Elimination: Goal: Will not experience complications related to bowel motility Outcome: Adequate for Discharge   Problem: Pain Managment: Goal: General experience of comfort will improve Outcome: Adequate for Discharge   Problem: Safety: Goal: Ability to remain free from injury will improve Outcome: Adequate for Discharge   Problem: Skin Integrity: Goal: Risk for impaired skin integrity will decrease Outcome: Adequate for Discharge   Problem: Education: Goal: Ability to verbalize activity precautions or restrictions will improve Outcome: Adequate for Discharge Goal: Knowledge of the prescribed therapeutic regimen will improve Outcome: Adequate for Discharge Goal: Understanding of discharge needs will improve Outcome: Adequate for Discharge   Problem: Activity: Goal: Ability to avoid complications of mobility impairment will improve Outcome: Adequate for Discharge Goal: Ability to tolerate increased activity will improve Outcome: Adequate for Discharge Goal: Will remain free from falls Outcome: Adequate for Discharge   Problem: Bowel/Gastric: Goal: Gastrointestinal status for postoperative course will improve Outcome: Adequate for Discharge   Problem: Clinical Measurements: Goal: Ability to maintain clinical measurements within normal limits will improve Outcome: Adequate for Discharge Goal: Postoperative complications will be avoided or minimized Outcome: Adequate for Discharge Goal: Diagnostic test results will improve Outcome: Adequate for  Discharge   Problem: Pain Management: Goal: Pain level will decrease Outcome: Adequate for Discharge   Problem: Skin Integrity: Goal: Will show signs of wound healing Outcome: Adequate for Discharge   Problem: Health Behavior/Discharge Planning: Goal: Identification of resources available to assist in meeting health care needs will improve Outcome: Adequate for Discharge   Problem: Bladder/Genitourinary: Goal: Urinary functional status for postoperative course will improve Outcome: Adequate for Discharge   Problem: Acute Rehab PT Goals(only PT should resolve) Goal: Pt Will Go Sit To Supine/Side Outcome: Adequate for Discharge Goal: Patient Will Transfer Sit To/From Stand Outcome: Adequate for Discharge Goal: Pt Will Ambulate Outcome: Adequate for Discharge Goal: Pt Will Go Up/Down Stairs Outcome: Adequate for Discharge

## 2019-02-10 NOTE — Evaluation (Signed)
Physical Therapy Evaluation Patient Details Name: Mason Decker MRN: [id number] DOB: Jan 06, 1971 Today's Date: 02/10/2019   History of Present Illness  48 y.o. male with history of recurrent HNP s/p L5-S1 revision decompression and insertion of interbody device. Pt with PMH of HTN and stroke.  Clinical Impression  Pt demonstrates deficits in LE strength, power, gait, functional mobility, balance, and endurance. Pt is able to perform all mobility required in the home setting with use of a RW and supervision. Pt will benefit from continued acute PT services to progress LE strengthening, balance, gait, and functional mobility. PT recommends discharge home with home health PT, transitioning to outpatient PT, no current DME needs.    Follow Up Recommendations Home health PT    Equipment Recommendations  None recommended by PT    Recommendations for Other Services       Precautions / Restrictions Precautions Precautions: Back Precaution Booklet Issued: Yes (comment) Required Braces or Orthoses: Spinal Brace Spinal Brace: Thoracolumbosacral orthotic;Applied in sitting position Restrictions Weight Bearing Restrictions: No      Mobility  Bed Mobility Overal bed mobility: Needs Assistance Bed Mobility: Rolling;Sidelying to Sit Rolling: Supervision Sidelying to sit: Supervision          Transfers Overall transfer level: Needs assistance Equipment used: Rolling walker (2 wheeled) Transfers: Sit to/from Stand Sit to Stand: Supervision         General transfer comment: pt performing 5 sit to stand repetitions from varying surfaces with supervision  Ambulation/Gait Ambulation/Gait assistance: Supervision Gait Distance (Feet): 100 Feet Assistive device: Rolling walker (2 wheeled) Gait Pattern/deviations: Step-through pattern;Decreased step length - right;Decreased step length - left Gait velocity: reduced Gait velocity interpretation: <1.8 ft/sec, indicate of risk for  recurrent falls General Gait Details: Pt with 3 trials, 15' x 3 and 100' x 1, decreased stride length and gait speed, otherwise WFL  Stairs Stairs: Yes Stairs assistance: Supervision Stair Management: Sideways;One rail Left Number of Stairs: 4    Wheelchair Mobility    Modified Rankin (Stroke Patients Only)       Balance Overall balance assessment: Needs assistance Sitting-balance support: Bilateral upper extremity supported;Feet supported Sitting balance-Leahy Scale: Fair     Standing balance support: No upper extremity supported Standing balance-Leahy Scale: Fair                               Pertinent Vitals/Pain Pain Assessment: No/denies pain    Home Living Family/patient expects to be discharged to:: Private residence Living Arrangements: Spouse/significant other Available Help at Discharge: Family;Available 24 hours/day Type of Home: House Home Access: Stairs to enter Entrance Stairs-Rails: Left Entrance Stairs-Number of Steps: 4 Home Layout: One level Home Equipment: Walker - 2 wheels;Bedside commode      Prior Function Level of Independence: Independent         Comments: works as Programmer, systems around gas pumps at [employer]   Dominant Hand: Right    Extremity/Trunk Assessment   Upper Extremity Assessment Upper Extremity Assessment: Overall WFL for tasks assessed    Lower Extremity Assessment Lower Extremity Assessment: Generalized weakness    Cervical / Trunk Assessment Cervical / Trunk Assessment: Normal  Communication   Communication: No difficulties  Cognition Arousal/Alertness: Awake/alert Behavior During Therapy: WFL for tasks assessed/performed Overall Cognitive Status: Within Functional Limits for tasks assessed  General Comments      Exercises     Assessment/Plan    PT Assessment Patient needs continued PT services  PT  Problem List Decreased strength;Decreased activity tolerance;Decreased balance;Decreased knowledge of use of DME;Decreased knowledge of precautions       PT Treatment Interventions DME instruction;Gait training;Stair training;Functional mobility training;Therapeutic activities;Balance training;Therapeutic exercise    PT Goals (Current goals can be found in the Care Plan section)  Acute Rehab PT Goals Patient Stated Goal: To return to prior level of function PT Goal Formulation: With patient Time For Goal Achievement: 02/24/19 Potential to Achieve Goals: Good    Frequency Min 5X/week   Barriers to discharge        Co-evaluation               AM-PAC PT "6 Clicks" Mobility  Outcome Measure Help needed turning from your back to your side while in a flat bed without using bedrails?: None Help needed moving from lying on your back to sitting on the side of a flat bed without using bedrails?: None Help needed moving to and from a bed to a chair (including a wheelchair)?: None Help needed standing up from a chair using your arms (e.g., wheelchair or bedside chair)?: None Help needed to walk in hospital room?: None Help needed climbing 3-5 steps with a railing? : None 6 Click Score: 24    End of Session Equipment Utilized During Treatment: Back brace Activity Tolerance: Patient tolerated treatment well Patient left: in chair;with call bell/phone within reach Nurse Communication: Mobility status PT Visit Diagnosis: Difficulty in walking, not elsewhere classified (R26.2)    Time: 3154-0086 PT Time Calculation (min) (ACUTE ONLY): 54 min   Charges:   PT Evaluation $PT Eval Low Complexity: 1 Low PT Treatments $Gait Training: 8-22 mins $Therapeutic Activity: 8-22 mins        Zenaida Niece, PT, DPT Acute Rehabilitation Pager: [phone]   Zenaida Niece 02/10/2019, 11:58 AM

## 2019-02-10 NOTE — Anesthesia Postprocedure Evaluation (Signed)
Anesthesia Post Note  Patient: Adria Devon  Procedure(s) Performed: RIGHT SIDED LUMBAR 5-SACRUM 1 revision decompression and  Transforaminal  INTERBODY FUSION (Right )     Patient location during evaluation: PACU Anesthesia Type: General Level of consciousness: awake and alert Pain management: pain level controlled Vital Signs Assessment: post-procedure vital signs reviewed and stable Respiratory status: spontaneous breathing, nonlabored ventilation, respiratory function stable and patient connected to nasal cannula oxygen Cardiovascular status: blood pressure returned to baseline and stable Postop Assessment: no apparent nausea or vomiting Anesthetic complications: no    Last Vitals:  Vitals:   02/10/19 0014 02/10/19 0350  BP: 100/73 126/83  Pulse: 90 86  Resp: 19 17  Temp: 36.4 C 36.6 C  SpO2: 98% 97%    Last Pain:  Vitals:   02/10/19 0350  TempSrc: Oral  PainSc:                  Tiajuana Amass

## 2019-02-10 NOTE — Evaluation (Signed)
Occupational Therapy Evaluation Patient Details Name: Mason Decker MRN: [id number] DOB: 11-Dec-1970 Today's Date: 02/10/2019    History of Present Illness 48 y.o. male with history of recurrent HNP s/p L5-S1 revision decompression and insertion of interbody device. Pt with PMH of HTN and stroke.   Clinical Impression   Pt is at independent - sup level with UB ADLs, grooming and toileting. Pt currently requires min A with LB ADLs. PTA, pt was independent with ADLs and functional mobility. Pt educated on use of ADL A/E and toileting aid for home use. Pt stood at sink with RW for hygiene and oral care with sup/set up. Pt will have assist at home and is able to don TLSO as well as instruct caregivers how to don properly. All education completed and no further acute OT is indicated at this time, OT will sign off    Follow Up Recommendations  Home Health OT;Supervision - Intermittent    Equipment Recommendations  Other (comment)(reacher, LH bath sponge, toileting aid)    Recommendations for Other Services       Precautions / Restrictions Precautions Precautions: Back Precaution Comments: reviewed back precations hadnout with pt Required Braces or Orthoses: Spinal Brace Spinal Brace: Thoracolumbosacral orthotic;Applied in sitting position Restrictions Weight Bearing Restrictions: No      Mobility Bed Mobility Overal bed mobility: Needs Assistance Bed Mobility: Rolling;Sidelying to Sit Rolling: Supervision Sidelying to sit: Supervision          Transfers Overall transfer level: Needs assistance Equipment used: Rolling walker (2 wheeled) Transfers: Sit to/from Stand Sit to Stand: Supervision              Balance Overall balance assessment: Needs assistance Sitting-balance support: Bilateral upper extremity supported;Feet supported Sitting balance-Leahy Scale: Fair     Standing balance support: No upper extremity supported Standing balance-Leahy Scale: Fair                              ADL either performed or assessed with clinical judgement   ADL Overall ADL's : Needs assistance/impaired Eating/Feeding: Independent;Sitting   Grooming: Wash/dry hands;Wash/dry face;Supervision/safety;Set up;Standing   Upper Body Bathing: Independent;Sitting   Lower Body Bathing: Minimal assistance;Sitting/lateral leans   Upper Body Dressing : Independent;Sitting   Lower Body Dressing: Minimal assistance;Sitting/lateral leans   Toilet Transfer: Supervision/safety;Ambulation;RW;Comfort height toilet;Grab bars   Toileting- Clothing Manipulation and Hygiene: Supervision/safety;Sit to/from stand   Tub/ Shower Transfer: Supervision/safety;Ambulation;Rolling walker;3 in 1;Grab bars   Functional mobility during ADLs: Cueing for safety;Supervision/safety General ADL Comments: Pt participated in oral care and hygiene standing at sink, toileting     Vision Patient Visual Report: No change from baseline       Perception     Praxis      Pertinent Vitals/Pain Pain Assessment: 0-10 Pain Score: 3  Pain Descriptors / Indicators: Aching;Sore Pain Intervention(s): Monitored during session;Repositioned;Patient requesting pain meds-RN notified     Hand Dominance Right   Extremity/Trunk Assessment Upper Extremity Assessment Upper Extremity Assessment: Overall WFL for tasks assessed   Lower Extremity Assessment Lower Extremity Assessment: Defer to PT evaluation   Cervical / Trunk Assessment Cervical / Trunk Assessment: Normal   Communication Communication Communication: No difficulties   Cognition Arousal/Alertness: Awake/alert Behavior During Therapy: WFL for tasks assessed/performed Overall Cognitive Status: Within Functional Limits for tasks assessed  General Comments       Exercises     Shoulder Instructions      Home Living Family/patient expects to be discharged to:: Private  residence Living Arrangements: Spouse/significant other Available Help at Discharge: Family;Available 24 hours/day Type of Home: House Home Access: Stairs to enter [employer] of Steps: 4 Entrance Stairs-Rails: Left Home Layout: One level     Bathroom Shower/Tub: Tub/shower unit;Walk-in shower   Bathroom Toilet: Standard Bathroom Accessibility: Yes   Home Equipment: Walker - 2 wheels;Bedside commode          Prior Functioning/Environment Level of Independence: Independent        Comments: works as Programmer, systems around gas pumps at [employer]        OT Problem List: Impaired balance (sitting and/or standing);Decreased activity tolerance;Pain      OT Treatment/Interventions:      OT Goals(Current goals can be found in the care plan section) Acute Rehab OT Goals Patient Stated Goal: To return to prior level of function OT Goal Formulation: With patient  OT Frequency:     Barriers to D/C:            Co-evaluation              AM-PAC OT "6 Clicks" Daily Activity     Outcome Measure Help from another person eating meals?: None Help from another person taking care of personal grooming?: A Little Help from another person toileting, which includes using toliet, bedpan, or urinal?: A Little Help from another person bathing (including washing, rinsing, drying)?: A Little Help from another person to put on and taking off regular upper body clothing?: None Help from another person to put on and taking off regular lower body clothing?: A Little 6 Click Score: 20   End of Session Equipment Utilized During Treatment: Rolling walker  Activity Tolerance: Patient tolerated treatment well Patient left: Other (comment);with nursing/sitter in room(in bathroom seated on toilet with RN)  OT Visit Diagnosis: Other abnormalities of gait and mobility (R26.89);Pain Pain - Right/Left: (back) Pain - part of body: (back)                Time:  7782-4235 OT Time Calculation (min): 46 min Charges:  OT General Charges $OT Visit: 1 Visit OT Evaluation $OT Eval Low Complexity: 1 Low OT Treatments $Self Care/Home Management : 8-22 mins $Therapeutic Activity: 8-22 mins    Galen Manila 02/10/2019, 2:33 PM

## 2019-02-10 NOTE — Plan of Care (Signed)
Per Lonn [place], [place]., pt's pain medications were sent to preferred pharmacy electronically.

## 2019-02-10 NOTE — Progress Notes (Signed)
Orthopedic Tech Progress Note Patient Details:  Mason Decker January 14, 1971 [id number]  Patient ID: Mason Decker, male   DOB: [birth year], 48 y.o.   MRN: [id number]   Mason Decker 02/10/2019, 8:23 AMCalled [employer] for TLSO brace.

## 2019-02-10 NOTE — TOC Transition Note (Addendum)
Transition of Care [facility]) - CM/SW Discharge Note   Patient Details  Name: Mason Decker MRN: [id number] Date of Birth: 02-24-1971  [facility] CM/SW Contact:  Midge Minium RN, BSN, NCM-BC, ACM-RN [phone] Phone Number: 02/10/2019, 1:19 PM   Clinical Narrative:    CM following for dispositional needs. CM spoke with patient to discuss POC. Patient states living at home alone and being independent PTA; DME available to assist with ADLs/ambulation. PCP/Demo verified. Patient is s/p L5-S1 revision decompression and insertion of interbody device. PT eval completed with HHPT recommended. CMS HH compare list provided with no preference (searching for an [place] agency), with the referrals denied. CM will arranged Outpatient PT at [place]; AVS updated. Patient stated having transportation home.    Final next level of care: OP Rehab Barriers to Discharge: No Barriers Identified   Patient Goals and CMS Choice Patient states their goals for this hospitalization and ongoing recovery are:: "getting better" CMS Medicare.gov Compare Post Acute Care list provided to:: Patient Choice offered to / list presented to : Patient   Discharge Plan and Services                DME Arranged: N/A DME Agency: NA       HH Arranged: PT HH Agency: [place] Date [place]: 02/10/19 Time [place]: 3500 Representative spoke with at [place]: Adela Lank RN  Social Determinants of Health (SDOH) Interventions     Readmission Risk Interventions No flowsheet data found.

## 2019-02-10 NOTE — Plan of Care (Signed)
  Problem: Pain Management: Goal: Pain level will decrease Outcome: Progressing   Problem: Safety: Goal: Ability to remain free from injury will improve Outcome: Progressing   

## 2019-02-11 NOTE — Discharge Summary (Signed)
Patient ID: Mason Decker MRN: [id number] DOB/AGE: Sep 12, 1970 48 y.o.  Admit date: 02/07/2019 Discharge date: 02/10/2019  Admission Diagnoses:  Active Problems:   Pre-op chest exam   Back pain   Discharge Diagnoses:  Same  Past Medical History:  Diagnosis Date   Hypertension    Severe lumbar pain 02/08/2019   Stroke [facility])     Surgeries: Procedure(s): RIGHT SIDED LUMBAR 5-SACRUM 1 revision decompression and  Transforaminal  INTERBODY FUSION on 02/09/2019   Consultants:  None  Discharged Condition: Improved  Hospital Course: Mason Decker is an 48 y.o. male who was admitted 02/07/2019 for operative treatment of radiculopathy. Patient has severe unremitting pain that affects sleep, daily activities, and work/hobbies. After pre-op clearance the patient was taken to the operating room on 02/09/2019 and underwent  Procedure(s): RIGHT SIDED LUMBAR 5-SACRUM 1 revision decompression and  Transforaminal  INTERBODY FUSION.    Patient was given perioperative antibiotics:  Anti-infectives (From admission, onward)   Start     Dose/Rate Route Frequency Ordered Stop   02/09/19 2100  ceFAZolin (ANCEF) IVPB 2g/100 mL premix     2 g 200 mL/hr over 30 Minutes Intravenous Every 8 hours 02/09/19 2056 02/10/19 0405   02/09/19 1227  ceFAZolin (ANCEF) 2-4 GM/100ML-% IVPB    Note to Pharmacy: Nyoka Cowden   : cabinet override      02/09/19 1227 02/10/19 0044       Patient was given sequential compression devices, early ambulation to prevent DVT.  Patient benefited maximally from hospital stay and there were no complications.    Recent vital signs:  Patient Vitals for the past 24 hrs:  BP Temp Temp src Pulse Resp SpO2  02/10/19 1458 117/75 98.7 F (37.1 C) Oral 98 16 97 %     Discharge Medications:   Allergies as of 02/10/2019   No Known Allergies     Medication List    STOP taking these medications   aspirin 81 MG EC tablet     TAKE these medications   amLODipine  10 MG tablet Commonly known as: NORVASC Take 1 tablet (10 mg total) by mouth daily.   atorvastatin 10 MG tablet Commonly known as: LIPITOR Take 1 tablet (10 mg total) by mouth daily at 6 PM.   lisinopril-hydrochlorothiazide 10-12.5 MG tablet Commonly known as: ZESTORETIC Take 1 tablet by mouth daily.       Diagnostic Studies: Dg Lumbar Decker 2-3 Views  Result Date: 02/09/2019 CLINICAL DATA:  L5-S1 decompression and interbody fusion EXAM: LUMBAR Decker - 2-3 VIEW; DG C-ARM 1-60 MIN COMPARISON:  Radiograph 02/09/2019 FINDINGS: Images depict interval placement of posterior fusion rod spanning L5-S1 with transpedicular screws bilaterally at each level. Interbody spacer placement is seen anteriorly in the disc level as well. No evidence of acute hardware complication. No unexpected radiopaque foreign bodies. IMPRESSION: Interval L5-S1 posterior fusion and interbody spacer placement without evidence of hardware complication. Electronically Signed   By: Lovena Le M.D.   On: 02/09/2019 19:19   Mason Decker Wo Contrast  Result Date: 02/05/2019 CLINICAL DATA:  Low back pain. Heavy lifting injury at work 01/31/2019. Right lower extremity pain. History of back surgery 11/19/2018 EXAM: MRI LUMBAR Decker WITHOUT CONTRAST TECHNIQUE: Multiplanar, multisequence Mason imaging of the lumbar Decker was performed. No intravenous contrast was administered. COMPARISON:  Lumbar radiographs 06/11/2007 and 11/19/2018 FINDINGS: Examination is limited by patient motion. Segmentation: There are five lumbar type vertebral bodies. The last full intervertebral disc space is labeled L5-S1.  Alignment:  Normal. Vertebrae:  Normal marrow signal.  No bone lesions or fractures. Conus medullaris and cauda equina: Conus extends to the bottom of L1 level. Conus and cauda equina appear normal. Paraspinal and other soft tissues: No significant paraspinal or retroperitoneal findings. Disc levels: L1-2: No significant findings. L2-3: No  significant findings. L3-4: Mild diffuse annular bulge with slight flattening of the ventral thecal sac and mild bilateral lateral recess encroachment. No foraminal stenosis. L4-5: Shallow central disc protrusion with mass effect on the thecal sac. There is also moderate facet disease and the combination contributes to mild spinal and bilateral lateral recess stenosis. No foraminal stenosis. L5-S1: Postoperative changes. There is a moderate-sized right-sided disc protrusion with mass effect on the right side of the thecal sac and on the right S1 nerve root. The exiting L5 nerve roots appear normal. IMPRESSION: 1. Limited examination due to patient motion and lack of IV contrast. 2. Moderate-sized right-sided recurrent disc protrusion at L5-S1. Significant mass effect on the right side of the thecal sac and on the right S1 nerve root. Recommend correlation with preoperative outside MRI if available. 3. Shallow central disc protrusion at L4-5 in combination with facet disease contributing to mild spinal and bilateral lateral recess stenosis. 4. Diffuse annular bulge at L3-4 with mild flattening of the ventral thecal sac and mild bilateral lateral recess encroachment. Electronically Signed   By: Rudie MeyerP.  Gallerani M.D.   On: 02/05/2019 10:45   Dg Lumbar Decker 1 View  Result Date: 02/09/2019 CLINICAL DATA:  Revision decompression EXAM: LUMBAR Decker - 1 VIEW COMPARISON:  MRI 02/05/2019 FINDINGS: Lumbar numbering consistent with MRI 02/05/2019. Single lateral view of the lumbar Decker. Two localizing instruments are visualized posteriorly. The tip of the more cephalad instrument is just above the spinous process of L4. The more caudal instrument points to the spinous process at L5. Small wire or catheter anterior to the sacrum. IMPRESSION: Limited lateral view of the lumbar Decker for intraoperative localization purposes. Electronically Signed   By: Jasmine PangKim  Fujinaga M.D.   On: 02/09/2019 17:57   Dg Chest Port 1 View  Result  Date: 02/07/2019 CLINICAL DATA:  Preop exam. EXAM: PORTABLE CHEST 1 VIEW COMPARISON:  None. FINDINGS: Mild opacity is seen in the left base. Cardiomediastinal silhouette is unremarkable. No pneumothorax. The right lung is clear. IMPRESSION: There is opacity in left base which could represent infiltrate or atelectasis. Recommend clinical correlation and follow-up to resolution. Electronically Signed   By: Gerome Samavid  Andreatta III M.D   On: 02/07/2019 18:01   Dg C-arm 1-60 Min  Result Date: 02/09/2019 CLINICAL DATA:  L5-S1 decompression and interbody fusion EXAM: LUMBAR Decker - 2-3 VIEW; DG C-ARM 1-60 MIN COMPARISON:  Radiograph 02/09/2019 FINDINGS: Images depict interval placement of posterior fusion rod spanning L5-S1 with transpedicular screws bilaterally at each level. Interbody spacer placement is seen anteriorly in the disc level as well. No evidence of acute hardware complication. No unexpected radiopaque foreign bodies. IMPRESSION: Interval L5-S1 posterior fusion and interbody spacer placement without evidence of hardware complication. Electronically Signed   By: Kreg ShropshirePrice  DeHay M.D.   On: 02/09/2019 19:19    Disposition:   Discharge Instructions    Ambulatory referral to Physical Therapy   Complete by: As directed    Outpatient PT   Call MD / Call 911   Complete by: As directed    If you experience chest pain or shortness of breath, CALL 911 and be transported to the hospital emergency room.  If you develope  a fever above 101 F, pus (white drainage) or increased drainage or redness at the wound, or calf pain, call your surgeon's office.   Constipation Prevention   Complete by: As directed    Drink plenty of fluids.  Prune juice may be helpful.  You may use a stool softener, such as Colace (over the counter) 100 mg twice a day.  Use MiraLax (over the counter) for constipation as needed.   Diet - low sodium heart healthy   Complete by: As directed    Increase activity slowly as tolerated   Complete  by: As directed      POD #1 s/p revision L5-S1 decompression with transforaminal lumbar interbody fusion  - radicular symptomatology entirely resolved with minimal expected postoperative low back pain well-controlled with current medications - up with PT/OT, encourage ambulation             -TLSO brace is to be worn at all times when out of bed.  Straps go under the arms. - patient needs to be ambulated at a minimum once every shift.  Patient needs to be out of bed as soon as possible as he has not ambulated since his surgery. - Percocet for pain, Valium for muscle spasms - d/c home today pending progress in physical therapy with f/u in 2 weeks at our office - discharge instructions and postoperative appointment card are in the patient's chart  Signed: Eilene Ghazi Charlis Harner 02/11/2019, 1:08 PM

## 2019-02-12 MED FILL — Heparin Sodium (Porcine) Inj 1000 Unit/ML: INTRAMUSCULAR | Qty: 30 | Status: AC

## 2019-02-12 MED FILL — Sodium Chloride IV Soln 0.9%: INTRAVENOUS | Qty: 1000 | Status: AC

## 2019-03-16 ENCOUNTER — Encounter (HOSPITAL_COMMUNITY): Payer: Self-pay | Admitting: Orthopedic Surgery

## 2019-06-26 ENCOUNTER — Other Ambulatory Visit: Payer: Self-pay | Admitting: Orthopedic Surgery

## 2019-06-26 DIAGNOSIS — G8929 Other chronic pain: Secondary | ICD-10-CM

## 2019-10-26 IMAGING — DX LUMBAR SPINE - 2-3 VIEW
3 series · 3 of 3 positions shown · non-contrast
Comparison: 06/12/2007

CLINICAL DATA: Preoperative study for lumbar surgery.

EXAM:
LUMBAR SPINE - 2-3 VIEW

[l-spine ap]
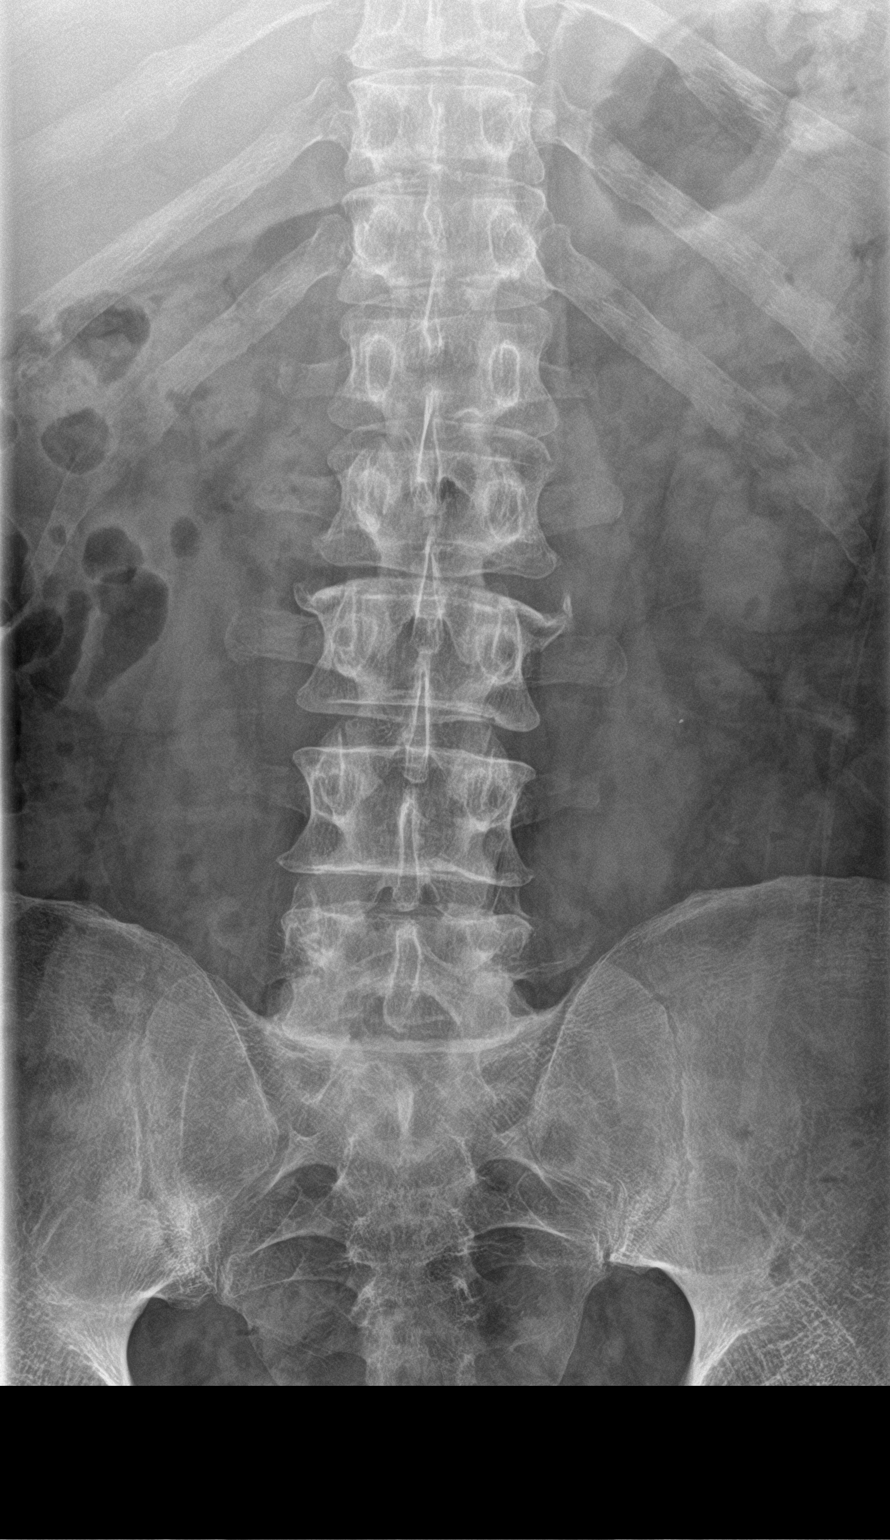

[l-spine lat]
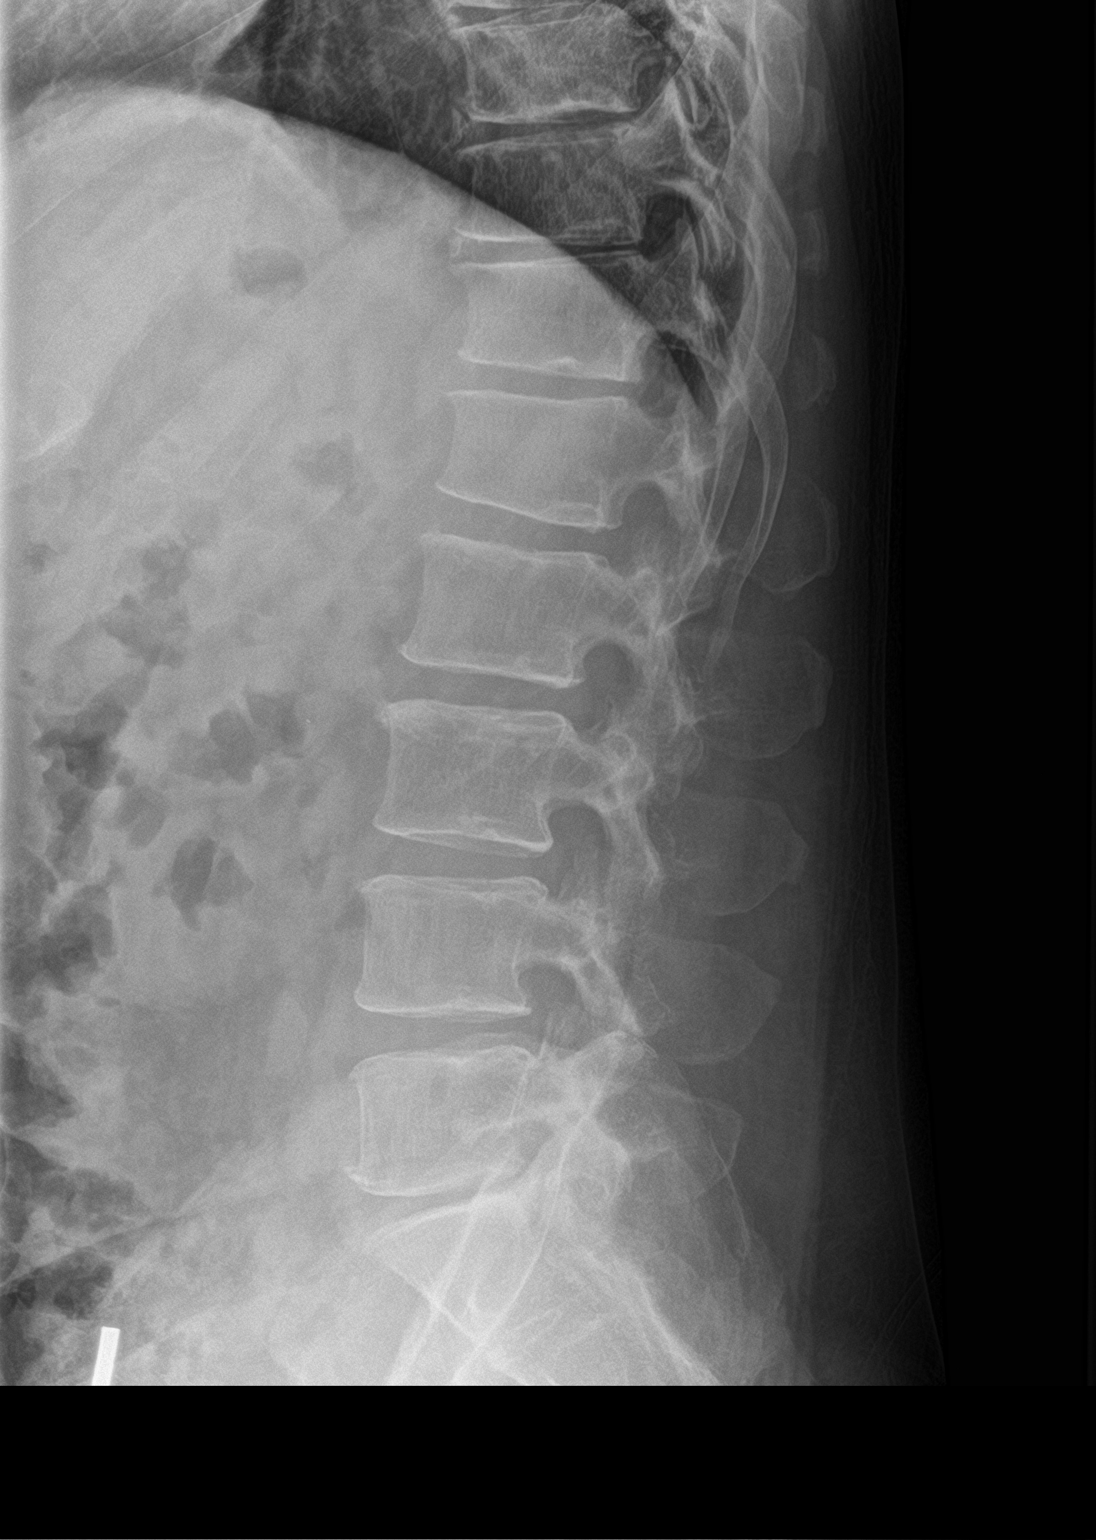

[l-spine spot]
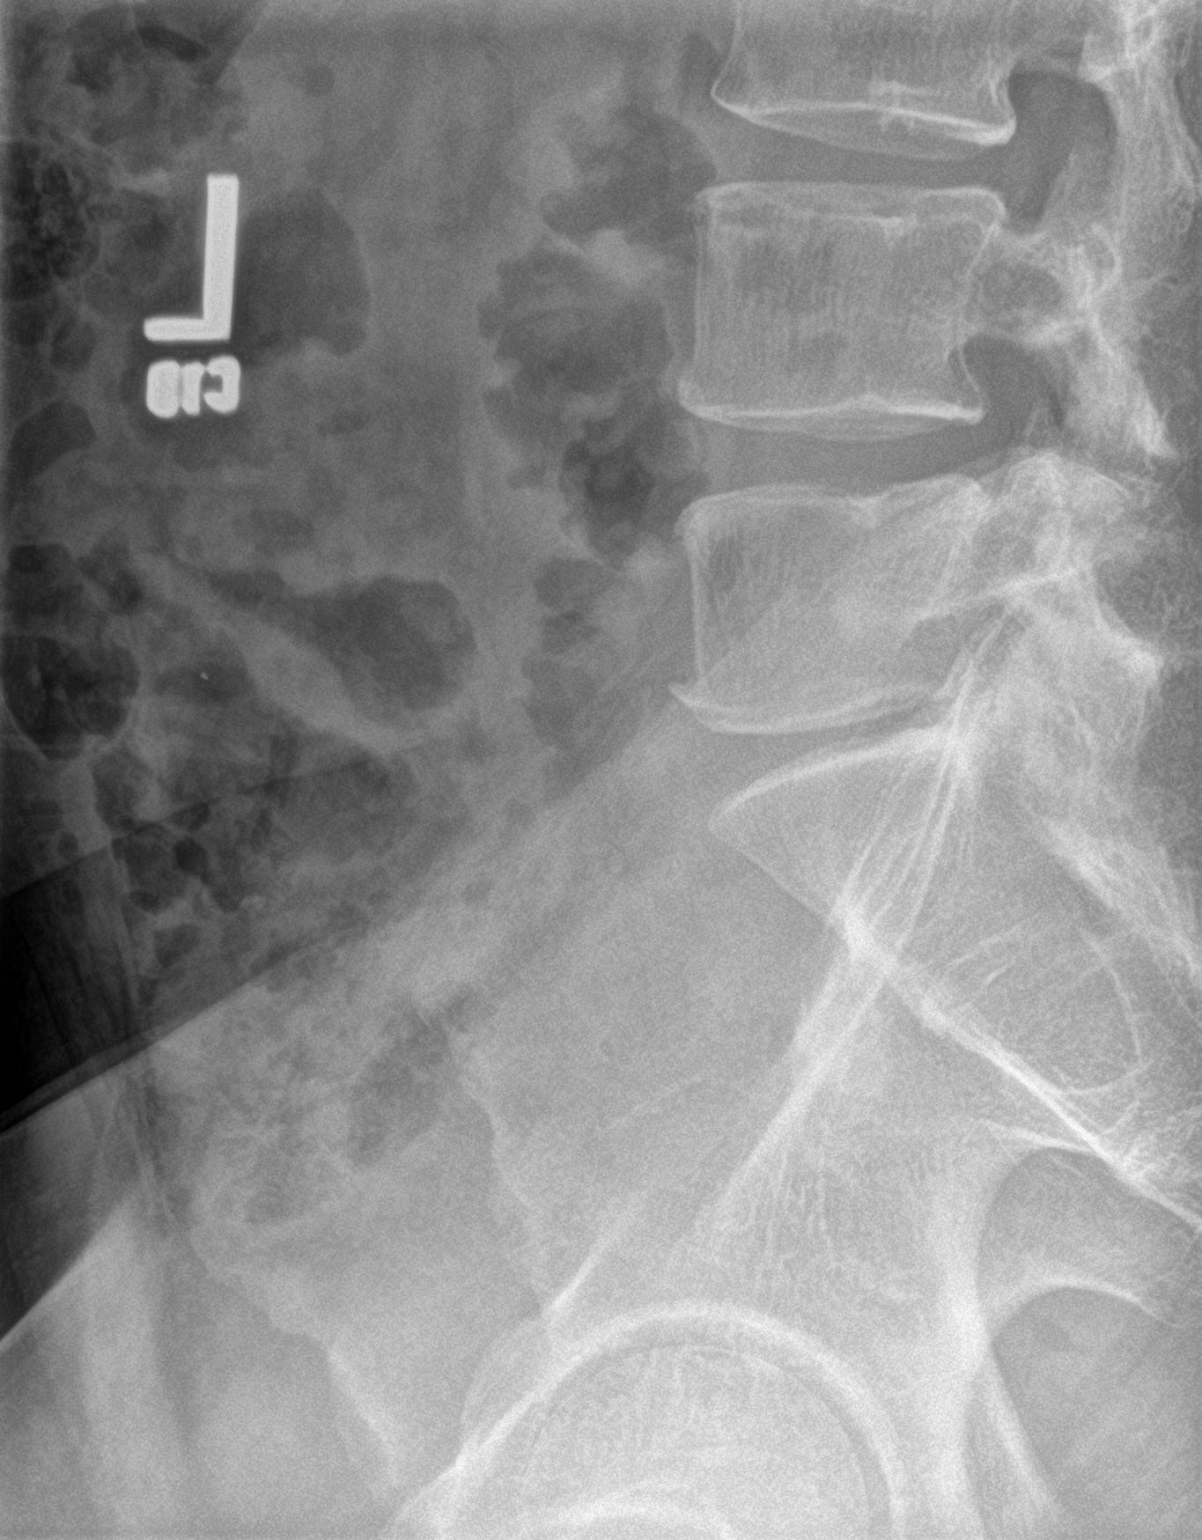

[3 of 3 positions shown; findings below may reference images not displayed]

FINDINGS: Straightening of the normal lumbar lordosis. Minimal thoracolumbar
curvature convex to the left. Normal disc space heights with the
exception of L5-S1 which shows mild-to-moderate narrowing. Mild
lower lumbar facet osteoarthritis.
IMPRESSION: Disc space narrowing L5-S1. Levels marked for operative correlation.
